# Patient Record
Sex: Male | Born: 1945 | Race: White | Hispanic: No | Marital: Married
Health system: Southern US, Community
[De-identification: ages and names within clinical notes are randomized; demographics above are authoritative.]

## PROBLEM LIST (undated history)

## (undated) DIAGNOSIS — I10 Essential (primary) hypertension: Secondary | ICD-10-CM

---

## 2003-07-12 ENCOUNTER — Other Ambulatory Visit: Payer: Self-pay

## 2003-07-13 ENCOUNTER — Other Ambulatory Visit: Payer: Self-pay

## 2003-07-14 ENCOUNTER — Other Ambulatory Visit: Payer: Self-pay

## 2005-11-08 ENCOUNTER — Ambulatory Visit: Payer: Self-pay | Admitting: Cardiology

## 2005-11-18 ENCOUNTER — Ambulatory Visit: Payer: Self-pay | Admitting: General Surgery

## 2008-12-05 ENCOUNTER — Ambulatory Visit: Payer: Self-pay | Admitting: Cardiology

## 2008-12-26 ENCOUNTER — Ambulatory Visit: Payer: Self-pay | Admitting: Unknown Physician Specialty

## 2010-01-16 IMAGING — CT CT ABD-PELV W/ CM
1 of 2 series · 15 of 32 positions shown, 19 images · non-contrast
Comparison: none

REASON FOR EXAM: acute appendicitis   CALL report  [phone]  or pager
#[phone]
COMMENTS:

[Series 2: appendicitis · axial · 0.71mm/px · z∈[+227,+704]mm · 15 of 175 slices shown, 19 images]
[im 8/175  soft-tissue]
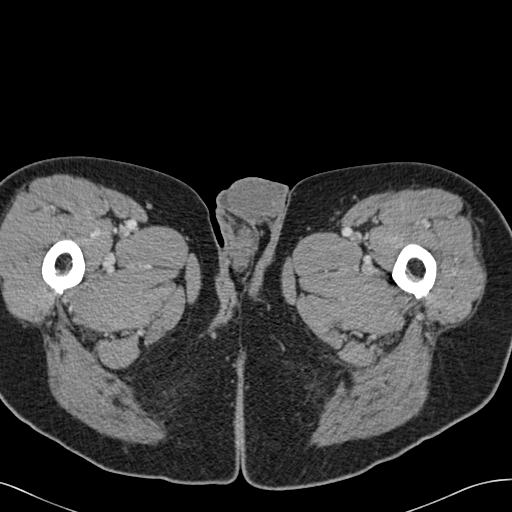
[im 8/175  bone]
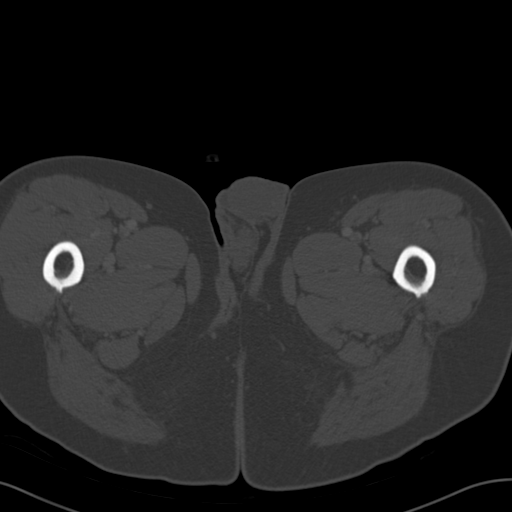
[im 22/175  soft-tissue]
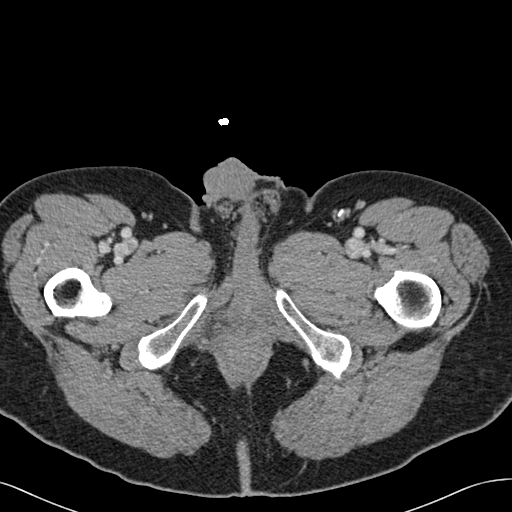
[im 37/175  soft-tissue]
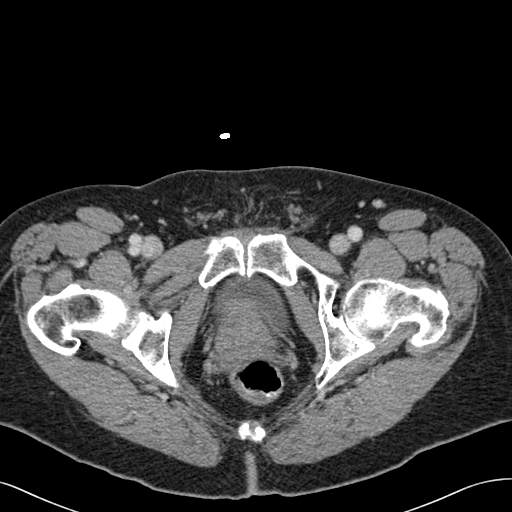
[im 51/175  soft-tissue]
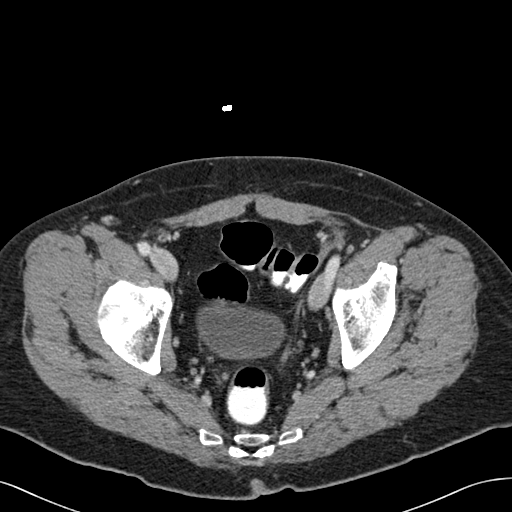
[im 59/175  soft-tissue]
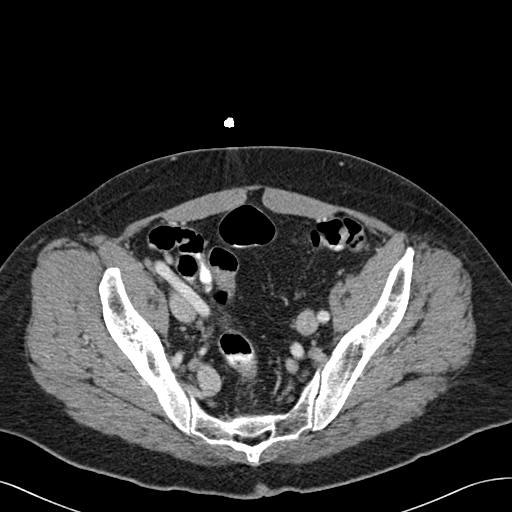
[im 73/175  soft-tissue]
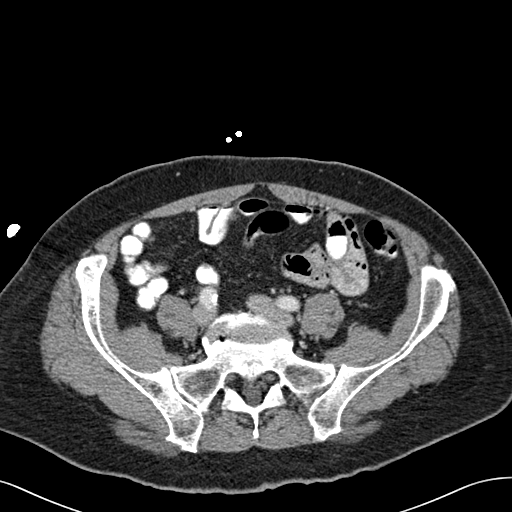
[im 88/175  soft-tissue]
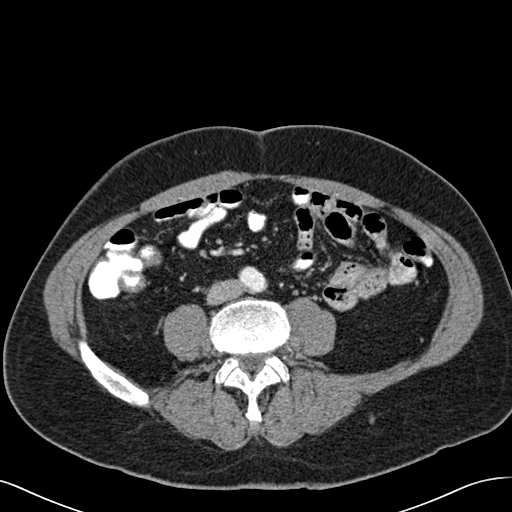
[im 102/175  soft-tissue]
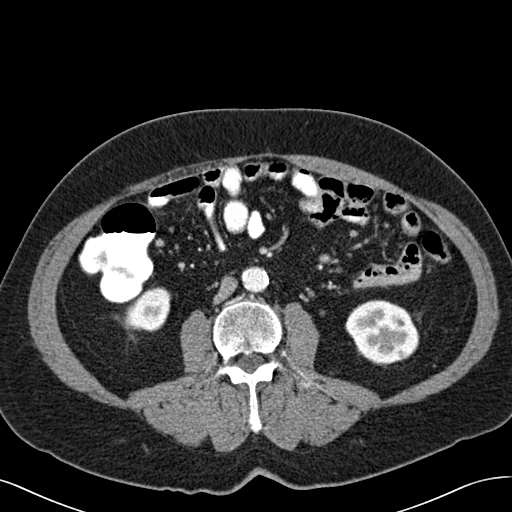
[im 117/175  soft-tissue]
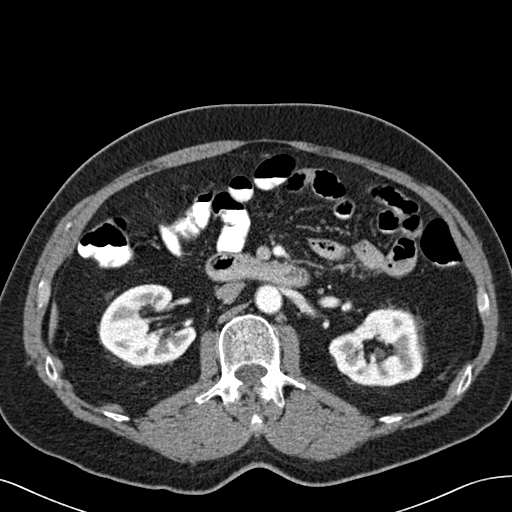
[im 117/175  bone]
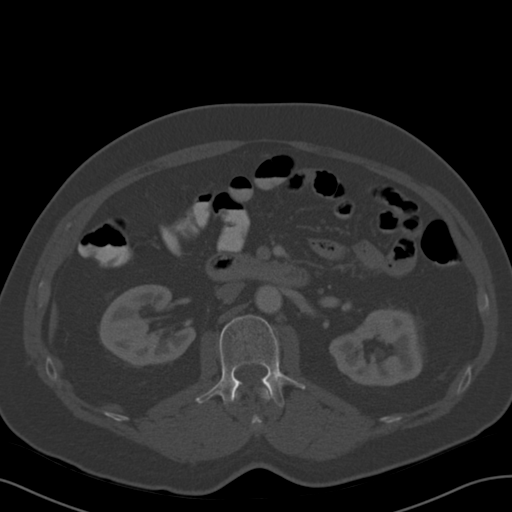
[im 124/175  soft-tissue]
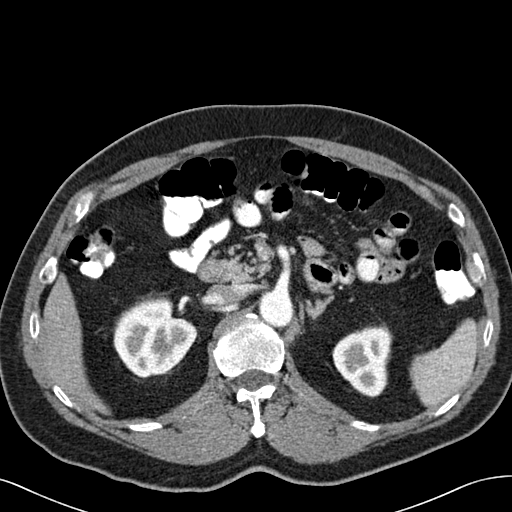
[im 138/175  soft-tissue]
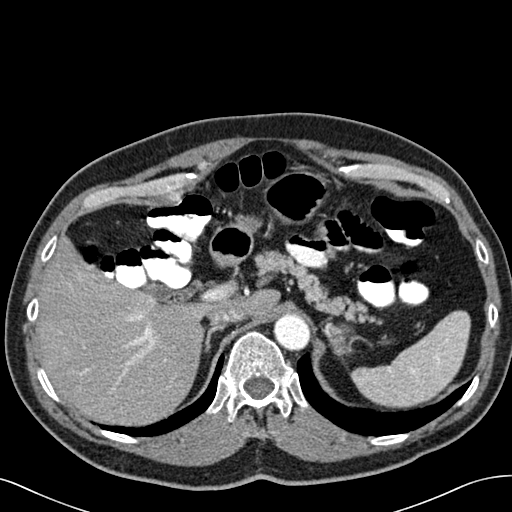
[im 146/175  lung]
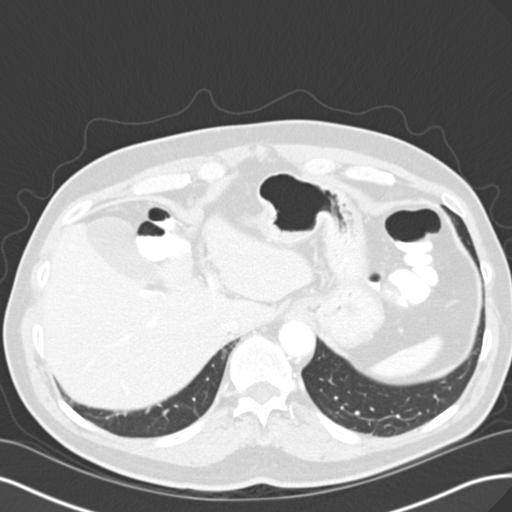
[im 153/175  soft-tissue]
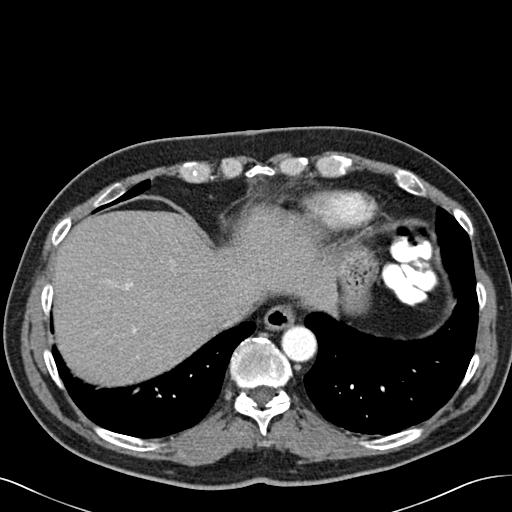
[im 153/175  lung]
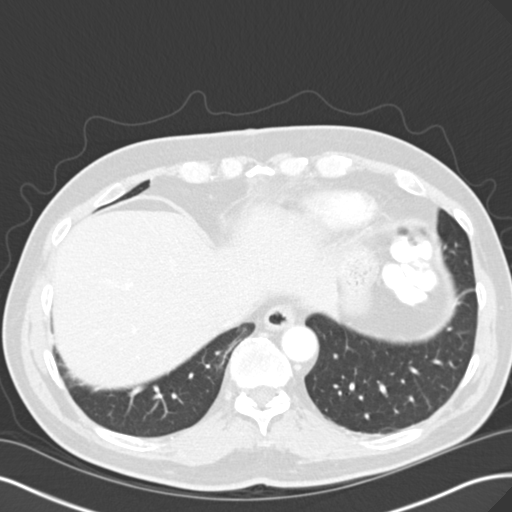
[im 160/175  lung]
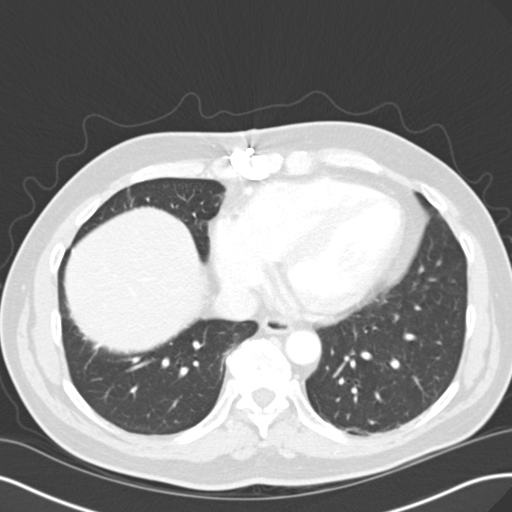
[im 167/175  soft-tissue]
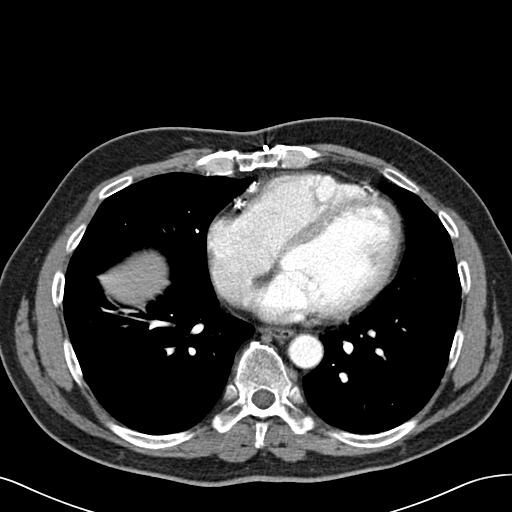
[im 167/175  lung]
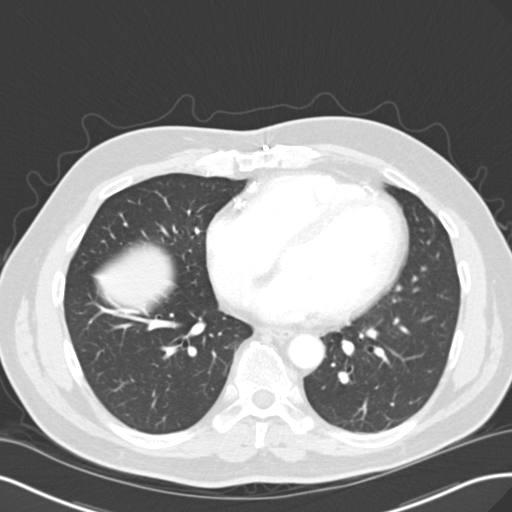

[15 of 32 positions shown; findings below may reference images not displayed]

PROCEDURE:     CT  - CT ABDOMEN / PELVIS  W  - December 26, 2008  [DATE]

RESULT:     CT of the abdomen and pelvis is performed utilizing oral
contrast and 100 mL of Esovue-KKB iodinated intravenous contrast. Images are
reconstructed in the axial plane at 3 mm slice thickness. Further
reconstructions were performed by the interpreting physician at the time of
evaluation utilizing multiplanar thin slice reconstructions and
three-dimensional volume reconstruction created by the webspace software.
There is no previous exam for comparison.

There does not appear to be significant distention of the appendix nor
significant periappendiceal stranding. There may be minimal periappendiceal
stranding adjacent to the base. There is no free fluid. There is no abscess.
There is no abnormal bowel distention. The urinary bladder is unremarkable.
There is some minimal calcification in the prostate. Correlate with PSA. No
definite urinary tract stones or obstructive changes are evident. There may
be some mild fatty infiltration of the liver. No radiopaque gallstones are
evident. The common bile duct is unremarkable. The pancreas, spleen and
adrenal glands as well as the abdominal aorta are within normal limits.
There some minimal atherosclerotic calcification with no aneurysm or
significant vascular stenosis. Again, there is no adenopathy. The lung bases
are clear. The bony structures are unremarkable.
IMPRESSION: No definite CT evidence of acute appendicitis. Findings
were discussed with Dr. Luxon and the patient. There may be some slightly
decreased attenuation of the liver suggestive of minimal fatty infiltration.
There is no free air or free fluid. There is no abscess.

## 2010-08-21 ENCOUNTER — Other Ambulatory Visit: Payer: Self-pay | Admitting: Radiology

## 2010-08-22 ENCOUNTER — Ambulatory Visit: Payer: Self-pay | Admitting: Unknown Physician Specialty

## 2020-02-26 ENCOUNTER — Other Ambulatory Visit: Payer: Self-pay | Admitting: Neurology

## 2020-02-26 DIAGNOSIS — G20 Parkinson's disease: Secondary | ICD-10-CM

## 2020-03-05 ENCOUNTER — Other Ambulatory Visit: Payer: Self-pay

## 2020-03-05 ENCOUNTER — Ambulatory Visit
Admission: RE | Admit: 2020-03-05 | Discharge: 2020-03-05 | Disposition: A | Discharge status: Home / Self Care | Payer: Medicare Other | Source: Ambulatory Visit | Attending: Neurology | Admitting: Neurology

## 2020-03-05 DIAGNOSIS — G20 Parkinson's disease: Secondary | ICD-10-CM | POA: Insufficient documentation

## 2020-12-30 ENCOUNTER — Other Ambulatory Visit: Payer: Self-pay

## 2020-12-30 ENCOUNTER — Emergency Department
Admission: EM | Admit: 2020-12-30 | Discharge: 2020-12-30 | Disposition: A | Discharge status: Home / Self Care | Payer: Medicare Other | Attending: Emergency Medicine | Admitting: Emergency Medicine

## 2020-12-30 DIAGNOSIS — I10 Essential (primary) hypertension: Secondary | ICD-10-CM | POA: Diagnosis not present

## 2020-12-30 DIAGNOSIS — Z79899 Other long term (current) drug therapy: Secondary | ICD-10-CM | POA: Diagnosis not present

## 2020-12-30 DIAGNOSIS — R030 Elevated blood-pressure reading, without diagnosis of hypertension: Secondary | ICD-10-CM | POA: Diagnosis present

## 2020-12-30 HISTORY — DX: Essential (primary) hypertension: I10

## 2020-12-30 MED ORDER — AMLODIPINE BESYLATE 5 MG PO TABS: 5.0000 mg | ORAL_TABLET | Freq: Every day | ORAL | 0 refills | Status: AC

## 2020-12-30 MED ORDER — AMLODIPINE BESYLATE 5 MG PO TABS
5.0000 mg | ORAL_TABLET | Freq: Once | ORAL | Status: AC
  Administered 2020-12-30: 5 mg via ORAL
  Filled 2020-12-30: qty 1

## 2020-12-30 NOTE — Discharge Instructions (Addendum)
Please return to the ER for any headache, vision changes, chest pain, shortness of breath or any urgent changes in your health.  Please record blood pressures twice daily and follow-up with primary care provider to discuss blood pressure recordings to see if you need to make any changes to your medications.

## 2020-12-30 NOTE — ED Notes (Signed)
No new orders per Siadecki at this time.

## 2020-12-30 NOTE — ED Provider Notes (Signed)
[facility] EMERGENCY DEPARTMENT Provider Note   CSN: [id number] Arrival date & time: 12/30/20  1658     History Chief Complaint  Patient presents with   Hypertension    Derrick Bishop is a 75 y.o. male with history of hypertension presents to the emergency department for evaluation of high blood pressure.  Patient was at neurology office earlier today when he was noted to have high blood pressure.  He states he is asymptomatic denies any headaches, vision changes, chest pain, shortness of breath dizziness or lightheadedness.  He takes propranolol and doxazosin daily, has been on these medications for years.  Has a history of open heart surgery 10 years ago.  Patient has been taking his medications regularly.  Patient admits to being very anxious.    HPI     Past Medical History:  Diagnosis Date   Hypertension     There are no problems to display for this patient.   History reviewed. No pertinent surgical history.     No family history on file.     Home Medications Prior to Admission medications   Medication Sig Start Date End Date Taking? Authorizing Provider  amLODipine (NORVASC) 5 MG tablet Take 1 tablet (5 mg total) by mouth daily. 12/30/20 12/30/21 Yes Derrick Slack, PA-C  carbidopa-levodopa (SINEMET IR) 25-100 MG tablet Take 1 tablet by mouth 3 (three) times daily.   Yes [provider]  doxazosin (CARDURA) 4 MG tablet Take 4 mg by mouth daily.   Yes [provider]  propranolol (INDERAL) 40 MG tablet Take 40 mg by mouth 3 (three) times daily.   Yes [provider]  rosuvastatin (CRESTOR) 10 MG tablet Take 10 mg by mouth daily.   Yes [provider]    Allergies    Patient has no allergy information on record.  Review of Systems   Review of Systems  Constitutional:  Negative for fatigue.  Eyes:  Negative for photophobia, pain, redness and visual disturbance.  Respiratory:  Negative for chest tightness  and shortness of breath.   Cardiovascular:  Negative for chest pain.  Gastrointestinal:  Negative for nausea and vomiting.  Skin:  Negative for rash.  Neurological:  Negative for dizziness, syncope, light-headedness, numbness and headaches.   Physical Exam Updated Vital Signs BP (!) 217/97 (BP Location: Right Arm)   Pulse 66   Temp 98 F (36.7 C)   Resp 18   SpO2 100%   Physical Exam Constitutional:      Appearance: He is well-developed.  HENT:     Head: Normocephalic and atraumatic.     Right Ear: Ear canal normal.     Left Ear: Ear canal normal.     Nose: Nose normal.     Mouth/Throat:     Pharynx: No oropharyngeal exudate or posterior oropharyngeal erythema.  Eyes:     Conjunctiva/sclera: Conjunctivae normal.  Cardiovascular:     Rate and Rhythm: Normal rate.     Pulses: Normal pulses.     Heart sounds: Normal heart sounds.  Pulmonary:     Effort: Pulmonary effort is normal. No respiratory distress.  Abdominal:     General: There is no distension.     Tenderness: There is no abdominal tenderness. There is no guarding.  Musculoskeletal:        General: Normal range of motion.     Cervical back: Normal range of motion.  Skin:    General: Skin is warm.  Findings: No rash.  Neurological:     General: No focal deficit present.     Mental Status: He is alert and oriented to person, place, and time. Mental status is at baseline.     Cranial Nerves: No cranial nerve deficit.     Motor: No weakness.     Gait: Gait normal.     Comments: Signs of anxiousness  Psychiatric:        Behavior: Behavior normal.        Thought Content: Thought content normal.    ED Results / Procedures / Treatments   Labs (all labs ordered are listed, but only abnormal results are displayed) Labs Reviewed - No data to display  EKG None  Radiology No results found.  Procedures Procedures   Medications Ordered in ED Medications  amLODipine (NORVASC) tablet 5 mg (has no  administration in time range)    ED Course  I have reviewed the triage vital signs and the nursing notes.  Pertinent labs & imaging results that were available during my care of the patient were reviewed by me and considered in my medical decision making (see chart for details).    MDM Rules/Calculators/A&P                          75 year old male with hypertension.  Patient asymptomatic, does appear to be anxious and admits to being anxious.  Blood pressure checked several times in both arms and does appear to be consistently elevated. Discussed HTN with patient, will start norvasc 5 mg daily and have patient record BP BID to discuss with PCP. He understands signs and symptoms to return to the ED for.     Final Clinical Impression(s) / ED Diagnoses Final diagnoses:  Hypertension, unspecified type    Rx / DC Orders ED Discharge Orders          Ordered    amLODipine (NORVASC) 5 MG tablet  Daily        12/30/20 1832             Derrick Bishop 12/30/20 1841    Derrick Cheek, MD 12/30/20 2122

## 2020-12-30 NOTE — ED Triage Notes (Signed)
Pt comes with c/o HTN. Pt states he went to his PCP for check up. Pt states they told him his BP was elevated and he needed to come here.   Pt denies any complaints. Pt states he takes BP meds and took it today.

## 2021-03-26 IMAGING — CT CT HEAD W/O CM
3 of 4 series · 14 of 47 positions shown, 16 images · non-contrast
Comparison: None.

CLINICAL DATA: Worsening hand tremors

EXAM:
CT HEAD WITHOUT CONTRAST
TECHNIQUE: Contiguous axial images were obtained from the base of the skull
through the vertex without intravenous contrast.

[Series 2: axial st head 5.00 ax · axial · 0.36mm/px · z∈[-518,-398]mm · 8 of 29 slices shown, 10 images]
[im 3/29  brain]
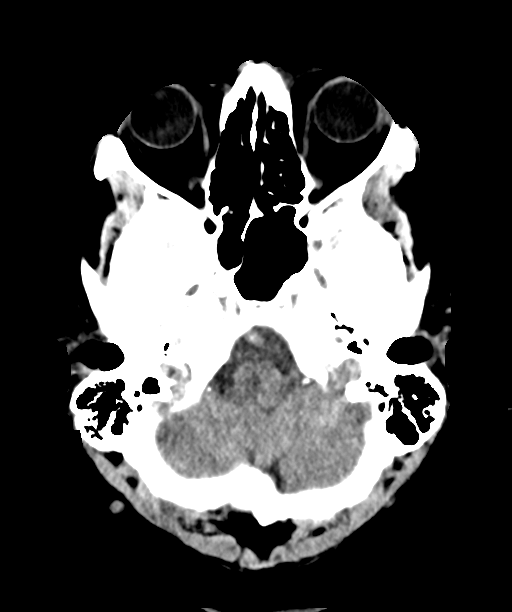
[im 3/29  bone]
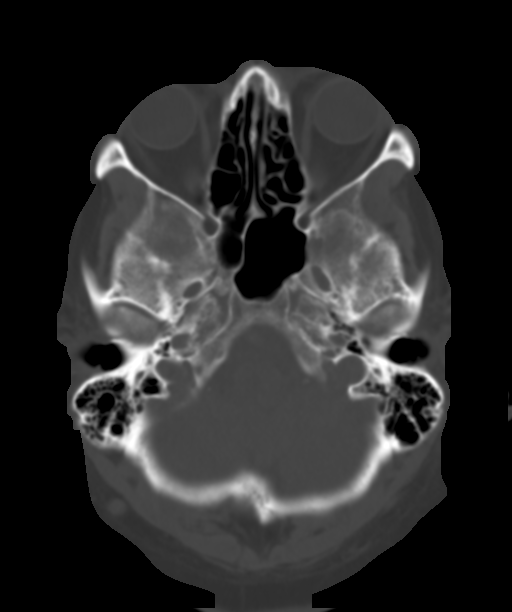
[im 7/29  brain]
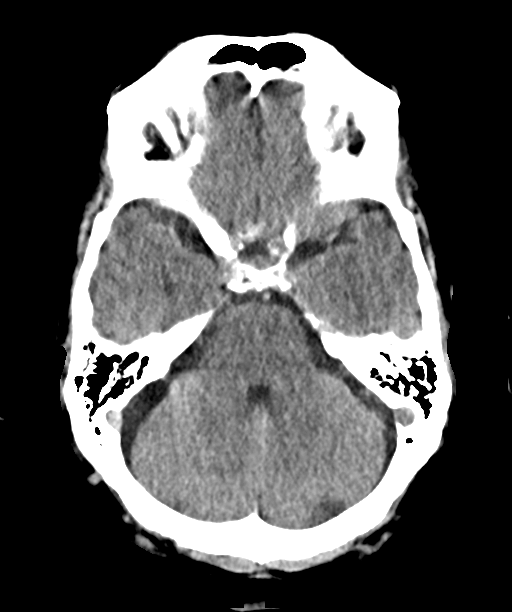
[im 11/29  brain]
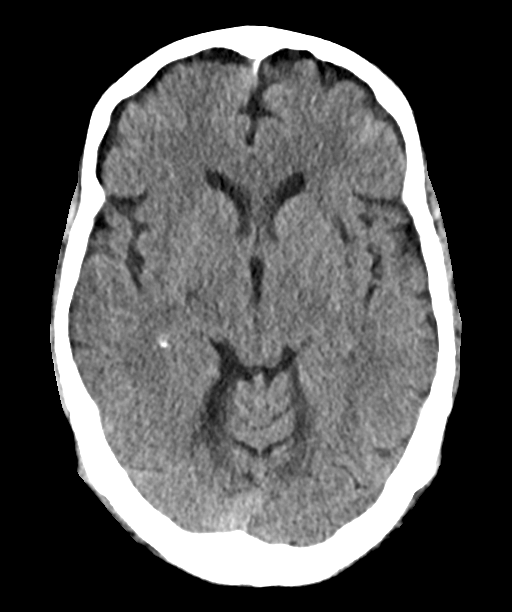
[im 13/29  brain]
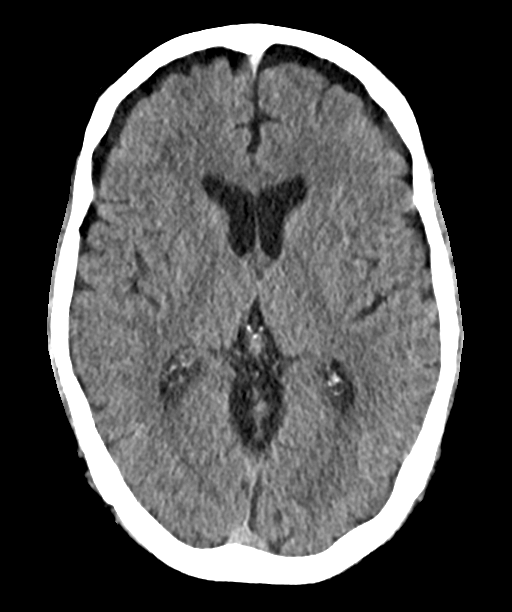
[im 17/29  brain]
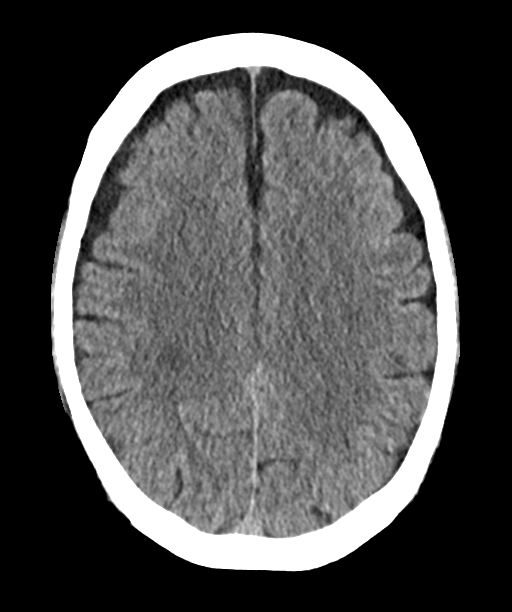
[im 17/29  bone]
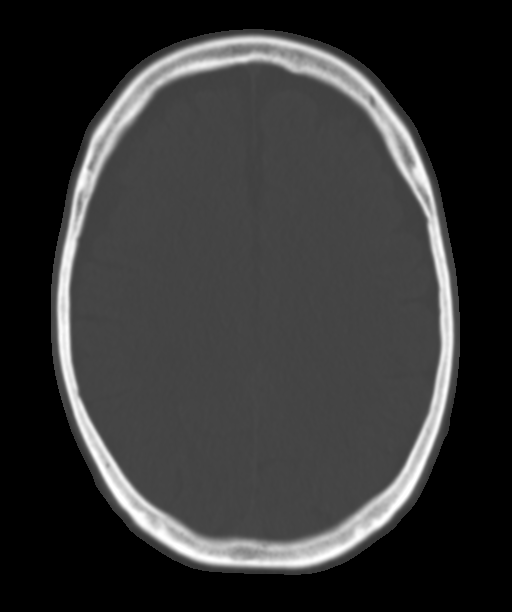
[im 19/29  brain]
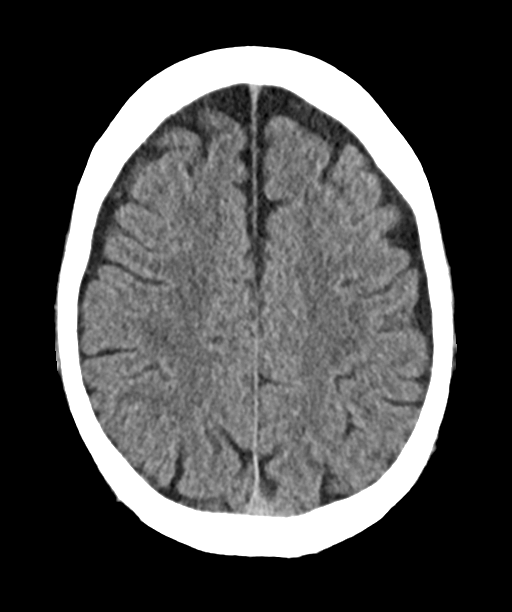
[im 23/29  brain]
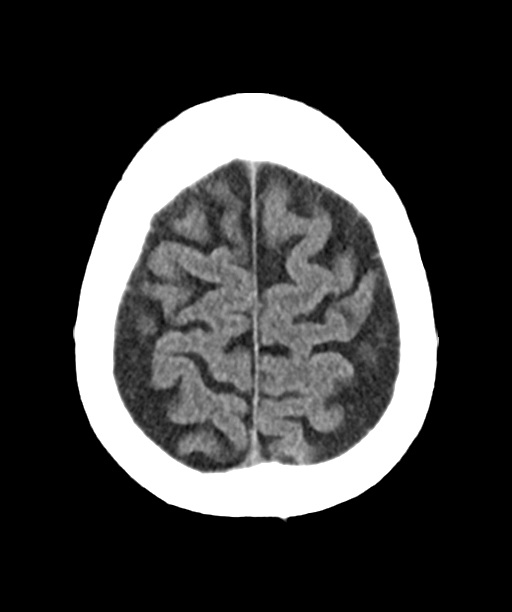
[im 27/29  brain]
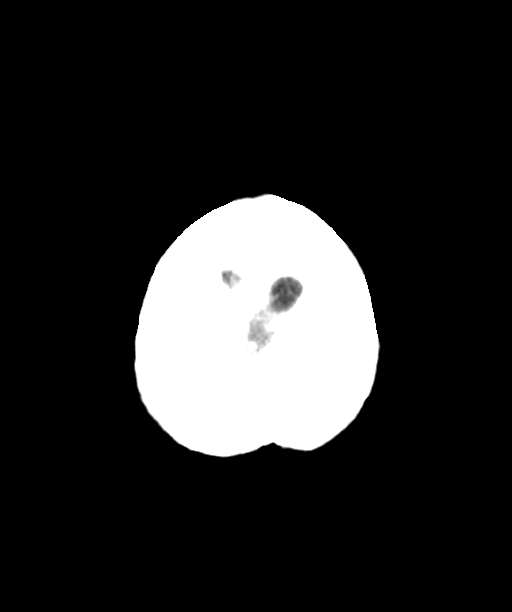

[Series 6: coronals head 3.00 cor · coronal · 0.29mm/px · 3 of 73 slices shown]
[im 25/73  brain]
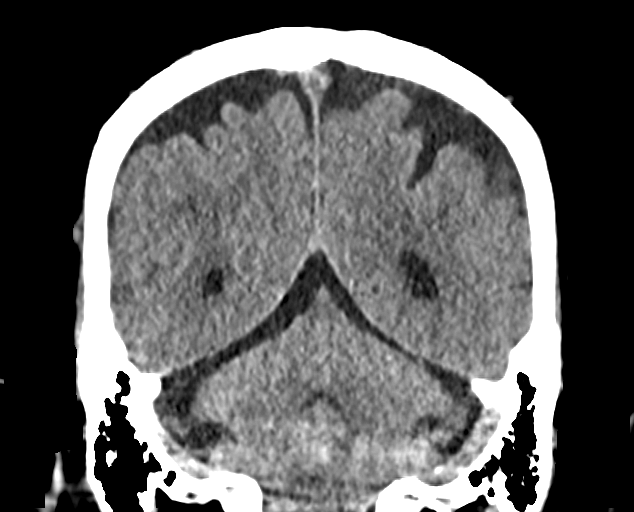
[im 33/73  brain]
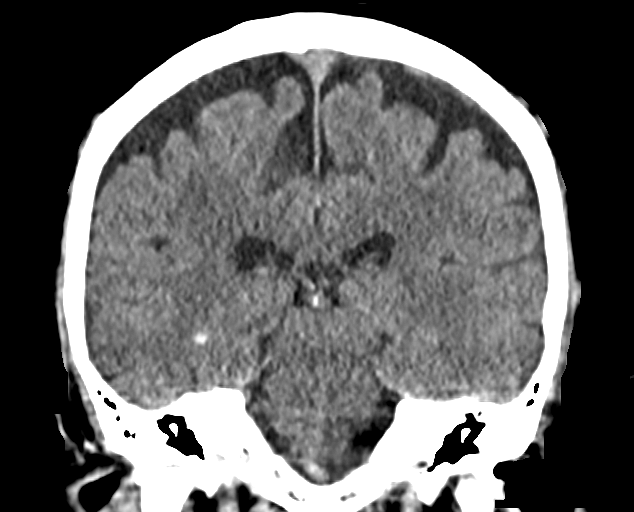
[im 41/73  brain]
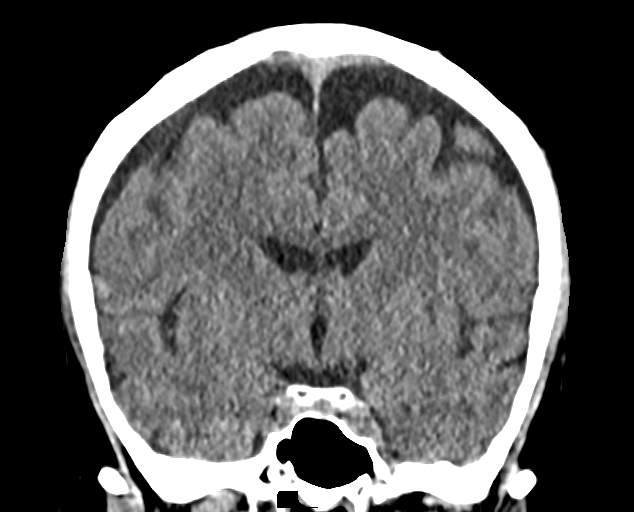

[Series 8: sagittals head 3.00 sag · sagittal · 0.29mm/px · 3 of 61 slices shown]
[im 21/61  brain]
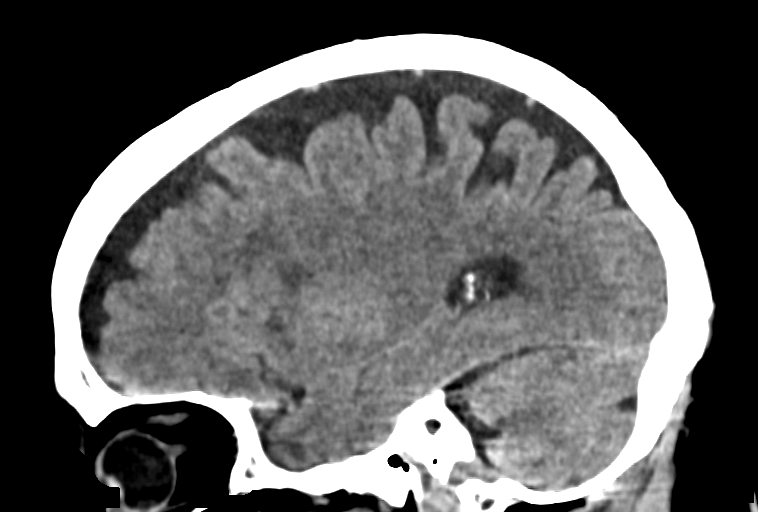
[im 31/61  brain]
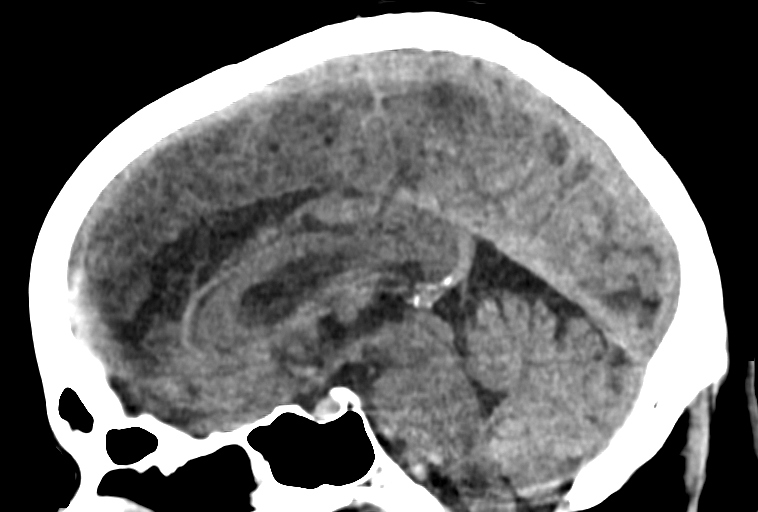
[im 41/61  brain]
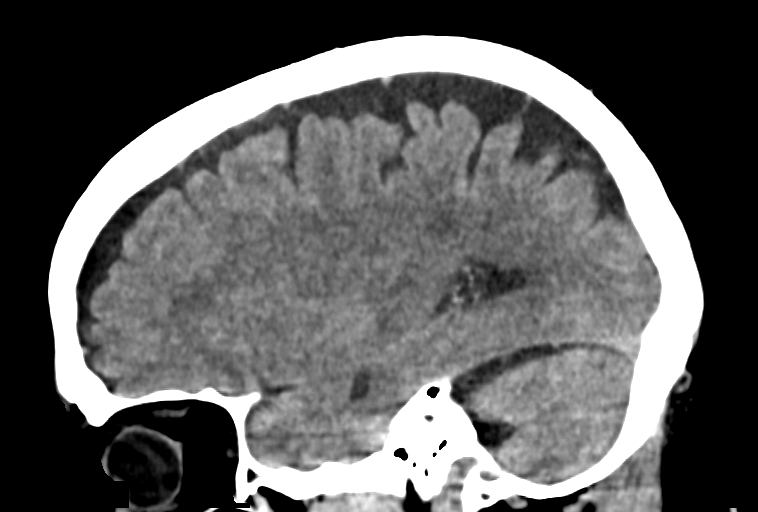

[14 of 47 positions shown; findings below may reference images not displayed]

FINDINGS: Brain: There is no acute intracranial hemorrhage, mass effect, or
edema. Gray-white differentiation is preserved. Ventricles and sulci
are within normal limits in size and configuration. Mild prominence
of extra-axial spaces along the cerebral and cerebellar convexities
likely reflects mild volume loss. No definite extra-axial fluid
collection. Patchy hypoattenuation in the supratentorial white
matter is nonspecific but may reflect mild chronic microvascular
ischemic changes.

Vascular: No hyperdense vessel or unexpected calcification.

Skull: Calvarium is unremarkable.

Sinuses/Orbits: No acute or significant finding.

Other: None.
IMPRESSION: No acute intracranial abnormality. Mild chronic microvascular
ischemic changes.

## 2021-12-28 ENCOUNTER — Emergency Department: Payer: Medicare Other

## 2021-12-28 ENCOUNTER — Other Ambulatory Visit: Payer: Self-pay

## 2021-12-28 ENCOUNTER — Emergency Department
Admission: EM | Admit: 2021-12-28 | Discharge: 2021-12-28 | Disposition: A | Discharge status: Home / Self Care | Payer: Medicare Other | Attending: Emergency Medicine | Admitting: Emergency Medicine

## 2021-12-28 DIAGNOSIS — Z79899 Other long term (current) drug therapy: Secondary | ICD-10-CM | POA: Insufficient documentation

## 2021-12-28 DIAGNOSIS — G20 Parkinson's disease: Secondary | ICD-10-CM | POA: Insufficient documentation

## 2021-12-28 DIAGNOSIS — R29810 Facial weakness: Secondary | ICD-10-CM | POA: Diagnosis not present

## 2021-12-28 DIAGNOSIS — I10 Essential (primary) hypertension: Secondary | ICD-10-CM | POA: Insufficient documentation

## 2021-12-28 DIAGNOSIS — Z7982 Long term (current) use of aspirin: Secondary | ICD-10-CM | POA: Diagnosis not present

## 2021-12-28 LAB — DIFFERENTIAL
Abs Immature Granulocytes: 0.03 10*3/uL (ref 0.00–0.07)
Basophils Absolute: 0 10*3/uL (ref 0.0–0.1)
Basophils Relative: 1 %
Eosinophils Absolute: 0.2 10*3/uL (ref 0.0–0.5)
Eosinophils Relative: 3 %
Immature Granulocytes: 1 %
Lymphocytes Relative: 23 %
Lymphs Abs: 1.5 10*3/uL (ref 0.7–4.0)
Monocytes Absolute: 0.4 10*3/uL (ref 0.1–1.0)
Monocytes Relative: 7 %
Neutro Abs: 4.1 10*3/uL (ref 1.7–7.7)
Neutrophils Relative %: 65 %

## 2021-12-28 LAB — CBC
HCT: 44.7 % (ref 39.0–52.0)
Hemoglobin: 14.7 g/dL (ref 13.0–17.0)
MCH: 29.5 pg (ref 26.0–34.0)
MCHC: 32.9 g/dL (ref 30.0–36.0)
MCV: 89.6 fL (ref 80.0–100.0)
Platelets: 143 10*3/uL — ABNORMAL LOW (ref 150–400)
RBC: 4.99 MIL/uL (ref 4.22–5.81)
RDW: 13.5 % (ref 11.5–15.5)
WBC: 6.3 10*3/uL (ref 4.0–10.5)
nRBC: 0 % (ref 0.0–0.2)

## 2021-12-28 LAB — PROTIME-INR
INR: 1 (ref 0.8–1.2)
Prothrombin Time: 13.4 seconds (ref 11.4–15.2)

## 2021-12-28 LAB — COMPREHENSIVE METABOLIC PANEL
ALT: <5 U/L (ref 0–44)
AST: 19 U/L (ref 15–41)
Albumin: 3.8 g/dL (ref 3.5–5.0)
Alkaline Phosphatase: 63 U/L (ref 38–126)
Anion gap: 7 (ref 5–15)
BUN: 19 mg/dL (ref 8–23)
CO2: 25 mmol/L (ref 22–32)
Calcium: 9.2 mg/dL (ref 8.9–10.3)
Chloride: 107 mmol/L (ref 98–111)
Creatinine, Ser: 0.85 mg/dL (ref 0.61–1.24)
GFR, Estimated: >60 mL/min (ref >60)
Glucose, Bld: 112 mg/dL — ABNORMAL HIGH (ref 70–99)
Potassium: 4.2 mmol/L (ref 3.5–5.1)
Sodium: 139 mmol/L (ref 135–145)
Total Bilirubin: 0.8 mg/dL (ref 0.3–1.2)
Total Protein: 6.9 g/dL (ref 6.5–8.1)

## 2021-12-28 LAB — ETHANOL: Alcohol, Ethyl (B): <10 mg/dL (ref <10)

## 2021-12-28 LAB — APTT: aPTT: 31 seconds (ref 24–36)

## 2021-12-28 MED ORDER — SODIUM CHLORIDE 0.9% FLUSH: 3.0000 mL | Freq: Once | INTRAVENOUS | Status: DC

## 2021-12-28 MED ORDER — LORAZEPAM 2 MG/ML IJ SOLN: 1.0000 mg | INTRAMUSCULAR | Status: DC | PRN

## 2021-12-28 NOTE — ED Triage Notes (Signed)
Pt comes from [facility] with c/o facial droop. Per KC unknown LKW. Pt didn't recognize droop. Pt states he went to [facility] for neuro check up. Pt states hx of bells palsy in past on right side. Pt denies any blurry vision, dizziness, weakness or any other symptoms.   Pt does have facial droop noted. Pt is van neg.

## 2021-12-28 NOTE — ED Provider Notes (Signed)
[facility] Provider Note    Event Date/Time   First MD Initiated Contact with Patient 12/28/21 [phone]     (approximate)   History   Chief Complaint Facial Droop   HPI  JENIEL SLAUSON is a 76 y.o. male with past medical history of hypertension and Parkinson disease who presents to the ED complaining of facial droop.  Patient reports that he went to his usually scheduled neurology appointment this morning for Parkinson's disease and was told there that his left face was drooping.  He states that he has not noticed this at home and he is unsure how long it has been going on for him.  He denies any vision changes, speech changes, numbness or weakness in his extremities.  Wife at bedside states she also was not aware of any facial drooping.  Patient reports a history of Bell's palsy on the right side about 10 years ago, has not had any issues since then.     Physical Exam   Triage Vital Signs: ED Triage Vitals  Enc Vitals Group     BP 12/28/21 0843 (!) 198/101     Pulse Rate 12/28/21 0843 (!) 56     Resp 12/28/21 0843 16     Temp 12/28/21 0843 98.9 F (37.2 C)     Temp Source 12/28/21 0843 Oral     SpO2 12/28/21 0843 97 %     Weight 12/28/21 0858 200 lb (90.7 kg)     Height 12/28/21 0858 5\' 10"  (1.778 m)     Head Circumference --      Peak Flow --      Pain Score 12/28/21 0843 0     Pain Loc --      Pain Edu? --      Excl. in GC? --     Most recent vital signs: Vitals:   12/28/21 1040 12/28/21 1130  BP: (!) 161/93 (!) 193/85  Pulse: (!) 53 (!) 55  Resp: 12 17  Temp:    SpO2: 98% 99%    Constitutional: Alert and oriented. Eyes: Conjunctivae are normal. Head: Atraumatic. Nose: No congestion/rhinnorhea. Mouth/Throat: Mucous membranes are moist.  Cardiovascular: Normal rate, regular rhythm. Grossly normal heart sounds.  2+ radial pulses bilaterally. Respiratory: Normal respiratory effort.  No retractions. Lungs CTAB. Gastrointestinal: Soft  and nontender. No distention. Musculoskeletal: No lower extremity tenderness nor edema.  Neurologic:  Normal speech and language.  Left facial droop noted, does not appear to involve the upper face.    ED Results / Procedures / Treatments   Labs (all labs ordered are listed, but only abnormal results are displayed) Labs Reviewed  CBC - Abnormal; Notable for the following components:      Result Value   Platelets 143 (*)    All other components within normal limits  COMPREHENSIVE METABOLIC PANEL - Abnormal; Notable for the following components:   Glucose, Bld 112 (*)    All other components within normal limits  PROTIME-INR  APTT  DIFFERENTIAL  ETHANOL  I-STAT CREATININE, ED  CBG MONITORING, ED     EKG  ED ECG REPORT I, 02/28/22, the attending physician, personally viewed and interpreted this ECG.   Date: 12/28/2021  EKG Time: 8:51  Rate: 56  Rhythm: sinus bradycardia  Axis: Normal  Intervals:none  ST&T Change: None  RADIOLOGY CT head reviewed and interpreted by me with no hemorrhage or midline shift.  PROCEDURES:  Critical Care performed: No  Procedures   MEDICATIONS  ORDERED IN ED: Medications  sodium chloride flush (NS) 0.9 % injection 3 mL (3 mLs Intravenous Not Given 12/28/21 0850)  LORazepam (ATIVAN) injection 1 mg (has no administration in time range)     IMPRESSION / MDM / ASSESSMENT AND PLAN / ED COURSE  I reviewed the triage vital signs and the nursing notes.                              76 y.o. male with past medical history of hypertension and Parkinson disease who presents to the ED complaining of left-sided facial droop noted at his routine neurology follow-up earlier this morning.  Patient's presentation is most consistent with acute presentation with potential threat to life or bodily function.  Differential diagnosis includes, but is not limited to, stroke, TIA, Bell's palsy, electrolyte abnormality, chronic facial droop.  Patient  nontoxic-appearing and in no acute distress, vital signs remarkable for hypertension but otherwise reassuring.  Patient does appear to have a left-sided facial droop without involvement of his left upper face, no other focal deficits noted.  Presentation is concerning for acute stroke and we will further assess with CT scan, unfortunately patient without any last known well time.  Labs thus far are reassuring without significant anemia or leukocytosis, coags are within normal limits.  CT scan is negative for acute process, MRI was performed and also negative for acute stroke.  Findings reviewed with Dr. Viviann Spare of neurology, who agrees that patient is appropriate for discharge home with outpatient follow-up given he is already on aspirin and statin for stroke prevention and Bell's palsy thought to be unlikely given weakness involves patient's left upper face.  Patient counseled to return to the ED for new worsening symptoms, patient agrees to plan.      FINAL CLINICAL IMPRESSION(S) / ED DIAGNOSES   Final diagnoses:  Facial droop     Rx / DC Orders   ED Discharge Orders     None        Note:  This document was prepared using Dragon voice recognition software and may include unintentional dictation errors.   Chesley Noon, MD 12/28/21 [phone]

## 2021-12-28 NOTE — Plan of Care (Signed)
The patient with history of Parkinson's and right Bell's palsy.  Has new left facial droop only discovered on routine visit today.  No other focal findings per ER physician.  MRI completed was negative for CVA.  He is on a statin and aspirin for stroke prevention.  Patient is anxious to go home and can be followed up outpatient as there is no emergent intervention to offer.

## 2021-12-28 NOTE — ED Notes (Signed)
Pt taken to MRI  

## 2023-10-03 ENCOUNTER — Ambulatory Visit: Attending: Neurology

## 2023-10-03 ENCOUNTER — Ambulatory Visit

## 2023-10-03 DIAGNOSIS — R2681 Unsteadiness on feet: Secondary | ICD-10-CM | POA: Insufficient documentation

## 2023-10-03 DIAGNOSIS — G20A1 Parkinson's disease without dyskinesia, without mention of fluctuations: Secondary | ICD-10-CM | POA: Diagnosis present

## 2023-10-03 DIAGNOSIS — M6281 Muscle weakness (generalized): Secondary | ICD-10-CM

## 2023-10-03 DIAGNOSIS — R262 Difficulty in walking, not elsewhere classified: Secondary | ICD-10-CM | POA: Insufficient documentation

## 2023-10-03 DIAGNOSIS — R471 Dysarthria and anarthria: Secondary | ICD-10-CM | POA: Diagnosis present

## 2023-10-03 DIAGNOSIS — R278 Other lack of coordination: Secondary | ICD-10-CM | POA: Insufficient documentation

## 2023-10-03 DIAGNOSIS — R269 Unspecified abnormalities of gait and mobility: Secondary | ICD-10-CM

## 2023-10-03 NOTE — Therapy (Signed)
 OUTPATIENT SPEECH LANGUAGE PATHOLOGY PARKINSON'S EVALUATION   Patient Name: Derrick Bishop MRN: [id number] DOB:1946-06-15, 78 y.o., male Today's Date: 10/03/2023  PCP: Bethann Punches, MD REFERRING PROVIDER: Cristopher Peru, MD   End of Session - 10/03/23 1159     Visit Number 1    Number of Visits 24    Date for SLP Re-Evaluation 12/26/23    SLP Start Time 1010    SLP Stop Time  1050    SLP Time Calculation (min) 40 min    Activity Tolerance Patient tolerated treatment well             Past Medical History:  Diagnosis Date   Hypertension    No past surgical history on file. There are no active problems to display for this patient.   ONSET DATE:  ~3 years ago;   REFERRING DIAG: Parkinson's Disease without dyskinesia, without mention of fluctuations  THERAPY DIAG:  Dysarthria and anarthria  Parkinson's disease without dyskinesia or fluctuating manifestations (HCC)  Rationale for Evaluation and Treatment Rehabilitation  SUBJECTIVE:   SUBJECTIVE STATEMENT: Pt alert, pleasant, and cooperative. Pt accompanied by: significant other  PERTINENT HISTORY: Per Neuro note, 09/15/23, "Parkinson's Disease in a patient with bilateral hand tremor (left > right) + slowness of movement + some changes in balance + hypophonia + micro graphia + hypomimia + minimal bradykinesia on the left + Mild cog wheeling on the right and moderate cog wheeling on the left + decreased arm swing on the left + slight hunched forward posture - symptomatic, without new or worsening features." Pt with hx HTN, HLD, Vit B12 and Vit deficiencies. MRI brain, 12/28/21, "no a cute infarction, hemorrhage, or mass. Mild chronic microvascular ischemic changes."    PAIN:  Are you having pain? No   FALLS: Has patient fallen in last 6 months? Defer to PT  LIVING ENVIRONMENT: Lives with: lives with their spouse Lives in: House/apartment  PLOF:  ambulates with single point cane; driving; indep  PATIENT GOALS  for  speech to improve  OBJECTIVE:  COGNITION: Overall cognitive status:  endorses some changes to short term memory over the last "few" months - will address in upcoming sessions   SOCIAL HISTORY: Occupation:  retired Counsellor intake: suboptimal Caffeine/alcohol intake: minimal and moderate Daily voice use: minimal  ORAL MOTOR EXAMINATION Facial : Symmetry impaired: Impaired left Lingual: WFL Velum: WFL Mandible: WFL; jaw tremor Cough: WFL Voice: Hoarse; Hypophonic   Patient does not report difficulty swallowing which does not warrant further evaluation  PERCEPTUAL VOICE ASSESSMENT: Voice quality: hoarse, low vocal intensity, and vocal fatigue Vocal abuse:  n/a Resonance: normal Respiratory function: clavicular breathing   LSVT-LOUD VOICE EVALUATION: Maximum phonation time for sustained "ah": 8s Mean intensity during sustained "ah": 77 dB  Mean fundamental frequency during sustained "ah": 130 Hz (average of 145 Hz +/- 60for age and gender) Mean intensity sustained during conversational speech:71 dB Mean intensity sustained during reading: 72dB Habitual pitch: 120 Hz Highest dynamic pitch when altering pitch from a low note to a high note: 286 Hz Lowest dynamic pitch when altering from a high note to a low note: 53 Hz Highest dynamic pitch during conversational speech: 170 Hz Lowest dynamic pitch during conversational speech: 107 Hz   Speech is characterized by Variable rate and Hypophonia. For trained listener in quiet environment with context, intelligibility was >95%.   Patient able to improve all parameters with model ("Loud like me") Stimulability: Improved vocal quality with loud voice to 74dB with cues at the  sentence level.   PATIENT REPORTED OUTCOME  The Communication Effectiveness Survey is a patient-reported outcome measure in which the patient rates their own effectiveness in different communication situations. A higher score indicates greater effectiveness.    Pt's self-rating was 24/32.   Having a conversation with a family member or friends at home. 3 Participating in conversation with strangers in a quiet place. 3 Conversing with a familiar person over the telephone. 4- Very Effective Conversing with a stranger over the telephone. 3 Being part of a conversation in a noisy environment (social gathering). 3 Speaking to a friend when you are emotionally upset or you are angry. 2 Having a conversation while traveling in a car. 4- Very Effective Having a conversation with someone at a distance (across a room). 3   TODAY'S TREATMENT:  Pt and wife educated re: role of SLP, changes to speech/voice due to Parkinson's Disease, results of assessment, SLP POC, and rationale for ST.   PATIENT EDUCATION: Education details: as above Person educated: Patient and Wife Education method: Explanation Education comprehension: verbalized understanding and needs further education   HOME EXERCISE PROGRAM: To be provided in upcoming sessions  GOALS: Goals reviewed with patient? Yes  SHORT TERM GOALS: Target date: 10 sessions  Pt will participate in further assessment of functional cognitive-linguistic ability with additional goals to be set as appropriate. Baseline: Goal status: INITIAL  2.  The patient will complete HEP (Maximum duration "ah", High/Lows, and Functional Phrases) at average loudness >/= 80 dB and with loud, good quality voice with min cues.  Baseline: Goal status: INITIAL  3.  Pt will sustain conversational loudness (75-85 dB) for 3-5 minutes of conversation with min cues. Baseline:  Goal status: INITIAL    LONG TERM GOALS: Target date: 12 weeks  The patient will complete HEP (Maximum duration "ah", High/Lows, and Functional Phrases) at average loudness >/= 80 dB and with loud, good quality voice indep. Baseline: Goal status: INITIAL  Pt will sustain conversational loudness (75-85 dB) for 8-10 minutes of conversation with  min cues. Baseline:  Goal status: INITIAL   ASSESSMENT:  CLINICAL IMPRESSION: Patient is a 78 y.o. male who was seen today for evaluation in setting of Parkinson's Disease. er Neuro note, 09/15/23, "Parkinson's Disease in a patient with bilateral hand tremor (left > right) + slowness of movement + some changes in balance + hypophonia + micro graphia + hypomimia + minimal bradykinesia on the left + Mild cog wheeling on the right and moderate cog wheeling on the left + decreased arm swing on the left + slight hunched forward posture - symptomatic, without new or worsening features." Pt taking carbidopa-levodopa for Parkinson's Disease with recent increased in dosage.  Pt with hx HTN, HLD, Vit B12 and Vit deficiencies. MRI brain, 12/28/21, "no a cute infarction, hemorrhage, or mass. Mild chronic microvascular ischemic changes." Pt presents with a mild hypokinetic dysarthric c/b hypophonia, mildly hoarse vocal quality and intermittent fast rushes of speech. Evidence of volume decay noted. Wife endorsed that speech is worse in home with pt often "muttering." Wife also identified x2 persons who have stopped communicating with pt due to difficulty hearing him. Pt stimulable to verbal cues and would benefit from course of ST to improve pt's ability to communicate at home and in the community.  OBJECTIVE IMPAIRMENTS include dysarthria. These impairments are limiting patient from effectively communicating at home and in community. Factors affecting potential to achieve goals and functional outcome are n/a. Patient will benefit from skilled SLP services to  address above impairments and improve overall function.  REHAB POTENTIAL: Good  PLAN: SLP FREQUENCY: 1-2x/week  SLP DURATION: 12 weeks  PLANNED INTERVENTIONS: Functional tasks, Multimodal communication approach, SLP instruction and feedback, Compensatory strategies, Patient/family education, and further cognitive-linguistic assessment.   Clyde Canterbury,  M.S., CCC-SLP Speech-Language Pathologist [place] [facility] [phone] Arnette Felts)   [facility] [address] [place], [place], [address] Phone: [phone]   Fax:  [phone]

## 2023-10-03 NOTE — Therapy (Signed)
 OUTPATIENT PHYSICAL THERAPY NEURO EVALUATION   Patient Name: Derrick Bishop MRN: [id number] DOB:16-Jan-1946, 78 y.o., male Today's Date: 10/03/2023   PCP: Dr. Bethann Punches REFERRING PROVIDER: Dr. Cristopher Peru  END OF SESSION:   Past Medical History:  Diagnosis Date   Hypertension    No past surgical history on file. There are no active problems to display for this patient.   ONSET DATE: 3 years ago  REFERRING DIAG: Parkinson's disease without dyskinesia, without mention of fluctuations   THERAPY DIAG:  No diagnosis found.  Rationale for Evaluation and Treatment: Rehabilitation  SUBJECTIVE:                                                                                                                                                                                             SUBJECTIVE STATEMENT: I just have to be careful with mobility- I dont use the cane at home- just take it with me. Patient reports having Parkinsons for about 3 years. He denies any falls but reports being very careful with all mobility. He reports he would like to be more confident in his mobility. Wife states he has been more sedentary and not as mobile.    Pt accompanied by: significant other- Joyce  PERTINENT HISTORY: History obtained by Dr. Sherryll Burger on 09/15/2023 Assessment & Plan Parkinson's Disease in a patient with bilateral hand tremor (left > right) + slowness of movement + some changes in balance + hypophonia + micro graphia + hypomimia + minimal bradykinesia on the left + Mild cog wheeling on the right and moderate cog wheeling on the left + decreased arm swing on the left + slight hunched forward posture - symptomatic, without new or worsening features   Parkinson's disease with worsening symptoms, including increased hand tremors, balance problems, and freezing episodes. Current treatment with carbidopa-levodopa three times daily shows no noticeable symptom improvement. Increasing the dose aims to  reduce tremors, bradykinesia, and rigidity. The medication is safe with no long-term damage, and higher doses may be necessary as the disease progresses. Early and adequate dosing can preserve functional years, and the highest tolerable dose should be administered early to maximize functional outcomes. - Increase carbidopa-levodopa to 25-100 mg two times a day  Increase carbidopa-levodopa to 1.5 pills three times daily for two weeks, then increase to 2 pills three times daily. - Order Parkinson's-specific Physical Therapy and Speech Therapy   PAIN:  Are you having pain? No  PRECAUTIONS: Fall  RED FLAGS: None   WEIGHT BEARING RESTRICTIONS: No  FALLS: Has patient fallen in last 6 months? No  LIVING ENVIRONMENT: Lives with: lives with their spouse  Lives in: House/apartment Stairs: Yes: External: 3 steps; In process of having railings installed in garage Has following equipment at home: Single point cane  PLOF: Independent  PATIENT GOALS: improve my walking and confidence in getting around.   OBJECTIVE:  Note: Objective measures were completed at Evaluation unless otherwise noted.  DIAGNOSTIC FINDINGS:  Narrative & Impression  CLINICAL DATA:  Neuro deficit, acute, stroke suspected   EXAM: MRI HEAD WITHOUT CONTRAST   TECHNIQUE: Multiplanar, multiecho pulse sequences of the brain and surrounding structures were obtained without intravenous contrast.   COMPARISON:  2012   FINDINGS: Brain: There is no acute infarction or intracranial hemorrhage. There is no intracranial mass, mass effect, or edema. There is no hydrocephalus or extra-axial fluid collection. Ventricles and sulci are within normal limits in size and configuration. Patchy T2 hyperintensity in the supratentorial white matter is nonspecific but may reflect mild chronic microvascular ischemic changes.   Vascular: Major vessel flow voids at the skull base are preserved.   Skull and upper cervical spine: Normal  marrow signal is preserved.   Sinuses/Orbits: Paranasal sinus mucosal thickening. Orbits are unremarkable.   Other: Sella is unremarkable.  Mastoid air cells are clear.   IMPRESSION: No acute infarction, hemorrhage, or mass.   Mild chronic microvascular ischemic changes.     Electronically Signed   By: Guadlupe Spanish M.D.   On: 12/28/2021 13:07      COGNITION: Overall cognitive status: Within functional limits for tasks assessed   SENSATION: WFL  COORDINATION: Slow to initiate at times  EDEMA:  None observed    POSTURE: rounded shoulders and forward head  LOWER EXTREMITY ROM:     Active  Right Eval Left Eval  Hip flexion    Hip extension    Hip abduction    Hip adduction    Hip internal rotation    Hip external rotation    Knee flexion    Knee extension    Ankle dorsiflexion    Ankle plantarflexion    Ankle inversion    Ankle eversion     (Blank rows = not tested)  LOWER EXTREMITY MMT:    MMT Right Eval Left Eval  Hip flexion 4 4  Hip extension 4 4  Hip abduction 4 4  Hip adduction    Hip internal rotation 4 4  Hip external rotation 4 4  Knee flexion 4 4  Knee extension 4 4  Ankle dorsiflexion 4 4  Ankle plantarflexion    Ankle inversion    Ankle eversion    (Blank rows = not tested)  BED MOBILITY:  NOT TESTED- Patient reports independent  TRANSFERS: Assistive device utilized: None  Sit to stand: Complete Independence Stand to sit: Complete Independence Chair to chair: Complete Independence Floor:  Not tested  GAIT: Gait pattern: decreased arm swing- Right, decreased arm swing- Left, decreased step length- Right, decreased step length- Left, decreased stance time- Right, decreased stance time- Left, decreased stride length, shuffling, festinating, poor foot clearance- Right, and poor foot clearance- Left Distance walked: > 100 feet Assistive device utilized:  has cane today but did not use- states only using in unfamiliar community  settings Level of assistance: CGA   FUNCTIONAL TESTS:  5 times sit to stand: 18.81 sec without UE support Timed up and go (TUG): 16.03 sec avg without AD 6 minute walk test: To be assessed next visit 10 meter walk test: 0.79 m/s avg without AD Berg Balance Scale: 43/56  PATIENT SURVEYS:  ABC scale 75.6%  TREATMENT DATE: 10/03/23     PATIENT EDUCATION: Education details: Purpose of PT; Characteristics associated with Parkinsons; plan of care; purpose of functional outcome measures Person educated: Patient and Spouse Education method: Explanation and Demonstration Education comprehension: verbalized understanding, returned demonstration, and verbal cues required  HOME EXERCISE PROGRAM: To be initiated next 1-2 visits  GOALS: Goals reviewed with patient? Yes  SHORT TERM GOALS: Target date: 11/14/2023  Pt will be independent with HEP in order to improve strength and balance in order to decrease fall risk and improve function at home and work. Baseline: Goal status: INITIAL   LONG TERM GOALS: Target date: 12/26/2023  1.  Patient (> 70 years old) will complete five times sit to stand test in < 15 seconds indicating an increased LE strength and improved balance. Baseline: EVAL= 18.81 sec without UE Goal status: INITIAL  2.  Patient will improve ABC score to >80%   to demonstrate statistically significant improvement in mobility and quality of life as it relates to their confidence in his balance.  Baseline: EVAL=75.6 % Goal status: INITIAL   4.  Patient will increase Berg Balance score by > 6 points to demonstrate decreased fall risk during functional activities. Baseline: EVAL= 43/56 Goal status: INITIAL   5.   Patient will reduce timed up and go to <11 seconds to reduce fall risk and demonstrate improved transfer/gait ability. Baseline: EVAL= 16.03  without AD Goal status: INITIAL  6.   Patient will increase 10 meter walk test to >1.38m/s as to improve gait speed for better community ambulation and to reduce fall risk. Baseline: EVAL= 0.79 m/s avg without AD Goal status: INITIAL  7.   Patient will increase six minute walk test distance to >1000 for progression to community ambulator and improve gait ability Baseline: EVAL: To be tested next visit Goal status: INITIAL    ASSESSMENT:  CLINICAL IMPRESSION: Patient is a 78 y.o. Male who was seen today for physical therapy evaluation and treatment for Parkinsons. He presents with decreased overall confidence in balance and impairment scoring 43/56 on BERG. He presents with mild LE muscle weakness and stiff trunk posture with forward trunk and shuffling gait with decreased speed placing him at increased risk of falling. He will benefit from skilled PT services to improve his functional strength and mobility and overall balance for optimal quality of life and decreased risk of falling.   OBJECTIVE IMPAIRMENTS: Abnormal gait, decreased balance, decreased coordination, decreased endurance, decreased mobility, difficulty walking, decreased ROM, decreased strength, hypomobility, impaired flexibility, and postural dysfunction.   ACTIVITY LIMITATIONS: carrying, lifting, bending, standing, squatting, stairs, and transfers  PARTICIPATION LIMITATIONS: cleaning, shopping, community activity, and yard work  PERSONAL FACTORS: 1-2 comorbidities: HTN  are also affecting patient's functional outcome.   REHAB POTENTIAL: Good  CLINICAL DECISION MAKING: Stable/uncomplicated  EVALUATION COMPLEXITY: Low  PLAN:  PT FREQUENCY: 1-2x/week  PT DURATION: 12 weeks  PLANNED INTERVENTIONS: 97164- PT Re-evaluation, 97110-Therapeutic exercises, 97530- Therapeutic activity, [id number]- Neuromuscular re-education, 97535- Self Care, 29562- Manual therapy, [id number]- Gait training, [phone]- Orthotic Fit/training, [phone]-  Canalith repositioning, [phone]- Electrical stimulation (manual), Patient/Family education, Balance training, Stair training, Taping, Dry Needling, Joint mobilization, Joint manipulation, Spinal manipulation, Spinal mobilization, Vestibular training, DME instructions, Cryotherapy, and Moist heat  PLAN FOR NEXT SESSION: 6 min walk- Revise goal as necessary. Implement Theract/therex/balance training as well as postural strengthening/trunk and LE ROM to assist with flexibility. Add to HEP as appropriate.   Lenda Kelp, PT 10/03/2023, 7:25 AM

## 2023-10-05 ENCOUNTER — Ambulatory Visit

## 2023-10-05 DIAGNOSIS — R278 Other lack of coordination: Secondary | ICD-10-CM

## 2023-10-05 DIAGNOSIS — R471 Dysarthria and anarthria: Secondary | ICD-10-CM

## 2023-10-05 DIAGNOSIS — R262 Difficulty in walking, not elsewhere classified: Secondary | ICD-10-CM

## 2023-10-05 DIAGNOSIS — M6281 Muscle weakness (generalized): Secondary | ICD-10-CM

## 2023-10-05 DIAGNOSIS — G20A1 Parkinson's disease without dyskinesia, without mention of fluctuations: Secondary | ICD-10-CM

## 2023-10-05 NOTE — Therapy (Signed)
 OUTPATIENT PHYSICAL THERAPY NEURO TREATMENT   Patient Name: Derrick Bishop MRN: [id number] DOB:Sep 15, 1945, 78 y.o., male Today's Date: 10/05/2023   PCP: Dr. Bethann Punches REFERRING PROVIDER: Dr. Cristopher Peru  END OF SESSION:  PT End of Session - 10/05/23 1306     Visit Number 2    Number of Visits 24    Date for PT Re-Evaluation 12/26/23    PT Start Time 1403    PT Stop Time 1444    PT Time Calculation (min) 41 min    Equipment Utilized During Treatment Gait belt    Activity Tolerance Patient tolerated treatment well    Behavior During Therapy WFL for tasks assessed/performed             Past Medical History:  Diagnosis Date   Hypertension    History reviewed. No pertinent surgical history. There are no active problems to display for this patient.   ONSET DATE: 3 years ago  REFERRING DIAG: Parkinson's disease without dyskinesia, without mention of fluctuations   THERAPY DIAG:  Other lack of coordination  Difficulty in walking, not elsewhere classified  Muscle weakness (generalized)  Parkinson's disease without dyskinesia or fluctuating manifestations (HCC)  Rationale for Evaluation and Treatment: Rehabilitation  SUBJECTIVE:                                                                                                                                                                                             SUBJECTIVE STATEMENT: Pt reports no aches/pains and no stumbles/falls. He reports looking over his should while walking can be tricky for balance/he's careful.  He has difficulty with walking up/down stairs; has to adjust after he has been sitting for a while.    Pt accompanied by: significant other- Joyce  PERTINENT HISTORY: History obtained by Dr. Sherryll Burger on 09/15/2023 Assessment & Plan Parkinson's Disease in a patient with bilateral hand tremor (left > right) + slowness of movement + some changes in balance + hypophonia + micro graphia + hypomimia +  minimal bradykinesia on the left + Mild cog wheeling on the right and moderate cog wheeling on the left + decreased arm swing on the left + slight hunched forward posture - symptomatic, without new or worsening features   Parkinson's disease with worsening symptoms, including increased hand tremors, balance problems, and freezing episodes. Current treatment with carbidopa-levodopa three times daily shows no noticeable symptom improvement. Increasing the dose aims to reduce tremors, bradykinesia, and rigidity. The medication is safe with no long-term damage, and higher doses may be necessary as the disease progresses. Early and adequate dosing can preserve functional years, and the highest  tolerable dose should be administered early to maximize functional outcomes. - Increase carbidopa-levodopa to 25-100 mg two times a day  Increase carbidopa-levodopa to 1.5 pills three times daily for two weeks, then increase to 2 pills three times daily. - Order Parkinson's-specific Physical Therapy and Speech Therapy   PAIN:  Are you having pain? No  PRECAUTIONS: Fall  RED FLAGS: None   WEIGHT BEARING RESTRICTIONS: No  FALLS: Has patient fallen in last 6 months? No  LIVING ENVIRONMENT: Lives with: lives with their spouse Lives in: House/apartment Stairs: Yes: External: 3 steps; In process of having railings installed in garage Has following equipment at home: Single point cane  PLOF: Independent  PATIENT GOALS: improve my walking and confidence in getting around.   OBJECTIVE:  Note: Objective measures were completed at Evaluation unless otherwise noted.  DIAGNOSTIC FINDINGS:  Narrative & Impression  CLINICAL DATA:  Neuro deficit, acute, stroke suspected   EXAM: MRI HEAD WITHOUT CONTRAST   TECHNIQUE: Multiplanar, multiecho pulse sequences of the brain and surrounding structures were obtained without intravenous contrast.   COMPARISON:  2012   FINDINGS: Brain: There is no acute  infarction or intracranial hemorrhage. There is no intracranial mass, mass effect, or edema. There is no hydrocephalus or extra-axial fluid collection. Ventricles and sulci are within normal limits in size and configuration. Patchy T2 hyperintensity in the supratentorial white matter is nonspecific but may reflect mild chronic microvascular ischemic changes.   Vascular: Major vessel flow voids at the skull base are preserved.   Skull and upper cervical spine: Normal marrow signal is preserved.   Sinuses/Orbits: Paranasal sinus mucosal thickening. Orbits are unremarkable.   Other: Sella is unremarkable.  Mastoid air cells are clear.   IMPRESSION: No acute infarction, hemorrhage, or mass.   Mild chronic microvascular ischemic changes.     Electronically Signed   By: Guadlupe Spanish M.D.   On: 12/28/2021 13:07      COGNITION: Overall cognitive status: Within functional limits for tasks assessed   SENSATION: WFL  COORDINATION: Slow to initiate at times  EDEMA:  None observed    POSTURE: rounded shoulders and forward head  LOWER EXTREMITY ROM:     Active  Right Eval Left Eval  Hip flexion    Hip extension    Hip abduction    Hip adduction    Hip internal rotation    Hip external rotation    Knee flexion    Knee extension    Ankle dorsiflexion    Ankle plantarflexion    Ankle inversion    Ankle eversion     (Blank rows = not tested)  LOWER EXTREMITY MMT:    MMT Right Eval Left Eval  Hip flexion 4 4  Hip extension 4 4  Hip abduction 4 4  Hip adduction    Hip internal rotation 4 4  Hip external rotation 4 4  Knee flexion 4 4  Knee extension 4 4  Ankle dorsiflexion 4 4  Ankle plantarflexion    Ankle inversion    Ankle eversion    (Blank rows = not tested)  BED MOBILITY:  NOT TESTED- Patient reports independent  TRANSFERS: Assistive device utilized: None  Sit to stand: Complete Independence Stand to sit: Complete Independence Chair to  chair: Complete Independence Floor:  Not tested  GAIT: Gait pattern: decreased arm swing- Right, decreased arm swing- Left, decreased step length- Right, decreased step length- Left, decreased stance time- Right, decreased stance time- Left, decreased stride length, shuffling, festinating, poor  foot clearance- Right, and poor foot clearance- Left Distance walked: > 100 feet Assistive device utilized:  has cane today but did not use- states only using in unfamiliar community settings Level of assistance: CGA   FUNCTIONAL TESTS:  5 times sit to stand: 18.81 sec without UE support Timed up and go (TUG): 16.03 sec avg without AD 6 minute walk test: 937 ftt 10 meter walk test: 0.79 m/s avg without AD Berg Balance Scale: 43/56  PATIENT SURVEYS:  ABC scale 75.6%                                                                                                                              TREATMENT DATE: 10/05/23     Physical Performance: 6 Min Walk Test:  Instructed patient to ambulate as quickly and as safely as possible for 6 minutes using LRAD. Patient was allowed to take standing rest breaks without stopping the test, but if the patient required a sitting rest break the clock would be stopped and the test would be over.  Results: 937 feet using a SPC. Results indicate that the patient has reduced endurance with ambulation compared to age matched norms.  Age Matched Norms (in meters): 16-69 yo M: 36 F: 75, 58-79 yo M: 15 F: 471, 37-89 yo M: 417 F: 392 MDC: 58.21 meters (190.98 feet) or 50 meters (ANPTA Core Set of Outcome Measures for Adults with Neurologic Conditions, 2018)  PT reviewed pt score/performance with pt   NMR: PT provided education on purpose and use of large-amplitude technique to override hypokinetic and bradykinetic movements.  PT provided VC/DEMO as well  Application of large-amplitude technique with the following: STS 3x10 with BUE abduction and finger abduction    Finger flicks 20x bilat UE  Review of HEP Access Code: [email or web address] URL: [email or web address] Date: 10/05/2023 Prepared by: Temple Pacini  Exercises - Sit to Stand Without Arm Support  - 1 x daily - 5-6 x weekly - 2-3 sets - 10 reps - Seated Finger Flicks with Elbow Extension  - 1 x daily - 7 x weekly - 2 sets - 20 reps - Walking  - 1 x daily - 7 x weekly - 1 sets - 1 reps - 5 minutes hold   Gait Big Walking with and without SPC x several minutes focus on upright posture, larger steps, and increased arm-swing     PATIENT EDUCATION: Education details: large amplitude technique, HEP,  Person educated: Patient Education method: Medical illustrator, VC printout Education comprehension: verbalized understanding, returned demonstration, and verbal cues required  HOME EXERCISE PROGRAM: Review of HEP Access Code: [email or web address] URL: [email or web address] Date: 10/05/2023 Prepared by: Temple Pacini  Exercises - Sit to Stand Without Arm Support  - 1 x daily - 5-6 x weekly - 2-3 sets - 10 reps - Seated Finger Flicks with Elbow Extension  - 1 x daily - 7 x weekly - 2 sets - 20 reps - Walking  - 1  x daily - 7 x weekly - 1 sets - 1 reps - 5 minutes hold   GOALS: Goals reviewed with patient? Yes  SHORT TERM GOALS: Target date: 11/14/2023  Pt will be independent with HEP in order to improve strength and balance in order to decrease fall risk and improve function at home and work. Baseline: Goal status: INITIAL   LONG TERM GOALS: Target date: 12/26/2023  1.  Patient (> 36 years old) will complete five times sit to stand test in < 15 seconds indicating an increased LE strength and improved balance. Baseline: EVAL= 18.81 sec without UE Goal status: INITIAL  2.  Patient will improve ABC score to >80%   to demonstrate statistically significant improvement in mobility and quality of life as it relates to their confidence in his balance.  Baseline:  EVAL=75.6 % Goal status: INITIAL   4.  Patient will increase Berg Balance score by > 6 points to demonstrate decreased fall risk during functional activities. Baseline: EVAL= 43/56 Goal status: INITIAL   5.   Patient will reduce timed up and go to <11 seconds to reduce fall risk and demonstrate improved transfer/gait ability. Baseline: EVAL= 16.03 without AD Goal status: INITIAL  6.   Patient will increase 10 meter walk test to >1.28m/s as to improve gait speed for better community ambulation and to reduce fall risk. Baseline: EVAL= 0.79 m/s avg without AD Goal status: INITIAL  7.   Patient will increase six minute walk test distance to >1000 for progression to community ambulator and improve gait ability Baseline: EVAL: To be tested next visit; 10/05/23: 937 ft  Goal status: INITIAL    ASSESSMENT:  CLINICAL IMPRESSION: Patient is a 78 y.o. Male who was seen today for physical therapy evaluation and treatment for Parkinsons. Completed this session with performance indicating impairment of gait/functional capacity. PT also initiated HEP and reviewed large-amplitude technique to override hypokinetic and bradykinetic movements. The pt will benefit from skilled PT services to improve his functional strength and mobility and overall balance for optimal quality of life and decreased risk of falling.   OBJECTIVE IMPAIRMENTS: Abnormal gait, decreased balance, decreased coordination, decreased endurance, decreased mobility, difficulty walking, decreased ROM, decreased strength, hypomobility, impaired flexibility, and postural dysfunction.   ACTIVITY LIMITATIONS: carrying, lifting, bending, standing, squatting, stairs, and transfers  PARTICIPATION LIMITATIONS: cleaning, shopping, community activity, and yard work  PERSONAL FACTORS: 1-2 comorbidities: HTN  are also affecting patient's functional outcome.   REHAB POTENTIAL: Good  CLINICAL DECISION MAKING:  Stable/uncomplicated  EVALUATION COMPLEXITY: Low  PLAN:  PT FREQUENCY: 1-2x/week  PT DURATION: 12 weeks  PLANNED INTERVENTIONS: 97164- PT Re-evaluation, 97110-Therapeutic exercises, 97530- Therapeutic activity, [id number]- Neuromuscular re-education, 97535- Self Care, 16109- Manual therapy, [id number]- Gait training, [phone]- Orthotic Fit/training, [phone]- Canalith repositioning, [phone]- Electrical stimulation (manual), Patient/Family education, Balance training, Stair training, Taping, Dry Needling, Joint mobilization, Joint manipulation, Spinal manipulation, Spinal mobilization, Vestibular training, DME instructions, Cryotherapy, and Moist heat  PLAN FOR NEXT SESSION: Theract/therex/balance training as well as postural strengthening/trunk and LE ROM to assist with flexibility. Add to HEP as appropriate.   Baird Kay, PT 10/05/2023, 5:13 PM

## 2023-10-05 NOTE — Therapy (Addendum)
 OUTPATIENT SPEECH LANGUAGE PATHOLOGY PARKINSON'S TREATMENT   Patient Name: Derrick Bishop MRN: [id number] DOB:07/21/1945, 78 y.o., male Today's Date: 10/05/2023  PCP: Firman Hughes, MD REFERRING PROVIDER: Devora Folks, MD   End of Session - 10/05/23 1312     Visit Number 2    Number of Visits 24    Date for SLP Re-Evaluation 12/26/23    SLP Start Time 1315    SLP Stop Time  1355    SLP Time Calculation (min) 40 min    Activity Tolerance Patient tolerated treatment well             Past Medical History:  Diagnosis Date   Hypertension    No past surgical history on file. There are no active problems to display for this patient.   ONSET DATE:  ~3 years ago;   REFERRING DIAG: Parkinson's Disease without dyskinesia, without mention of fluctuations  THERAPY DIAG:  Dysarthria and anarthria  Rationale for Evaluation and Treatment Rehabilitation  SUBJECTIVE:   SUBJECTIVE STATEMENT: Pt alert, pleasant, and cooperative. Pt accompanied by: self  PERTINENT HISTORY: Per Neuro note, 09/15/23, "Parkinson's Disease in a patient with bilateral hand tremor (left > right) + slowness of movement + some changes in balance + hypophonia + micro graphia + hypomimia + minimal bradykinesia on the left + Mild cog wheeling on the right and moderate cog wheeling on the left + decreased arm swing on the left + slight hunched forward posture - symptomatic, without new or worsening features." Pt with hx HTN, HLD, Vit B12 and Vit deficiencies. MRI brain, 12/28/21, "no a cute infarction, hemorrhage, or mass. Mild chronic microvascular ischemic changes."    PAIN:  Are you having pain? No   FALLS: Has patient fallen in last 6 months? Defer to PT  LIVING ENVIRONMENT: Lives with: lives with their spouse Lives in: House/apartment  PLOF:  ambulates with single point cane; driving; indep  PATIENT GOALS  for speech to improve  OBJECTIVE:  TODAY'S TREATMENT: Cognitive-linguistic screening: Pt  scored 29/30 on Mini ACE ([place] Cognitive Examination) which is [facility].   Voice ex's completed as follows: Sustained /a/: x10, ~84dB Ascending pitch glides: x10, ~83dB Descending pitch glides: x10, ~85db  Pt read x10 functional phrases twice through with loud and intentional voice, averaging 83dB. Co-created list of x8 functional phrases for HEP.  During short conversational exchanges, pt average 73-74 dB with evidence of volume decay. Improved to 76dB during paragraph reading task.  Pt benefit from use of biofeeback using Voice Analyst app.   Pt educated re: results of cognitive-linguistic screening, changes to speech/voice due to Parkinson's Disease, SLP POC, and HEP.  PATIENT EDUCATION: Education details: as above Person educated: Patient  Education method: Programmer, multimedia; Handout Education comprehension: verbalized understanding and needs further education   HOME EXERCISE PROGRAM: Voice Ex's and Functional Reading as outlined above  GOALS: Goals reviewed with patient? Yes  SHORT TERM GOALS: Target date: 10 sessions  Pt will participate in further assessment of functional cognitive-linguistic ability with additional goals to be set as appropriate. Baseline: Goal status: INITIAL  2.  The patient will complete HEP (Maximum duration "ah", High/Lows, and Functional Phrases) at average loudness >/= 80 dB and with loud, good quality voice with min cues.  Baseline: Goal status: INITIAL  3.  Pt will sustain conversational loudness (75-85 dB) for 3-5 minutes of conversation with min cues. Baseline:  Goal status: INITIAL    LONG TERM GOALS: Target date: 12 weeks  The patient will complete HEP (  Maximum duration "ah", High/Lows, and Functional Phrases) at average loudness >/= 80 dB and with loud, good quality voice indep. Baseline: Goal status: INITIAL  Pt will sustain conversational loudness (75-85 dB) for 8-10 minutes of conversation with min cues. Baseline:  Goal status:  INITIAL   ASSESSMENT:  CLINICAL IMPRESSION: Patient is a 78 y.o. male who was seen today for evaluation in setting of Parkinson's Disease. er Neuro note, 09/15/23, "Parkinson's Disease in a patient with bilateral hand tremor (left > right) + slowness of movement + some changes in balance + hypophonia + micro graphia + hypomimia + minimal bradykinesia on the left + Mild cog wheeling on the right and moderate cog wheeling on the left + decreased arm swing on the left + slight hunched forward posture - symptomatic, without new or worsening features." Pt taking carbidopa-levodopa for Parkinson's Disease with recent increased in dosage.  Pt with hx HTN, HLD, Vit B12 and Vit deficiencies. MRI brain, 12/28/21, "no a cute infarction, hemorrhage, or mass. Mild chronic microvascular ischemic changes." Pt presents with a mild hypokinetic dysarthric c/b hypophonia, mildly hoarse vocal quality and intermittent fast rushes of speech. Evidence of volume decay noted. Wife endorsed that speech is worse in home with pt often "muttering." Wife also identified x2 persons who have stopped communicating with pt due to difficulty hearing him. Pt stimulable to verbal cues and would benefit from course of ST to improve pt's ability to communicate at home and in the community.  OBJECTIVE IMPAIRMENTS include dysarthria. These impairments are limiting patient from effectively communicating at home and in community. Factors affecting potential to achieve goals and functional outcome are n/a. Patient will benefit from skilled SLP services to address above impairments and improve overall function.  REHAB POTENTIAL: Good  PLAN: SLP FREQUENCY: 1-2x/week  SLP DURATION: 12 weeks  PLANNED INTERVENTIONS: Functional tasks, Multimodal communication approach, SLP instruction and feedback, Compensatory strategies, Patient/family education, and further cognitive-linguistic assessment.   Dia Forget, M.S., CCC-SLP Speech-Language  Pathologist [place] [facility] [phone] Rogers Clayman)   [place] [facility] [address] [place], [place], [address] Phone: [phone]   Fax:  [phone]

## 2023-10-11 ENCOUNTER — Ambulatory Visit: Admitting: Physical Therapy

## 2023-10-11 ENCOUNTER — Ambulatory Visit

## 2023-10-11 DIAGNOSIS — R269 Unspecified abnormalities of gait and mobility: Secondary | ICD-10-CM

## 2023-10-11 DIAGNOSIS — R262 Difficulty in walking, not elsewhere classified: Secondary | ICD-10-CM

## 2023-10-11 DIAGNOSIS — G20A1 Parkinson's disease without dyskinesia, without mention of fluctuations: Secondary | ICD-10-CM

## 2023-10-11 DIAGNOSIS — M6281 Muscle weakness (generalized): Secondary | ICD-10-CM

## 2023-10-11 DIAGNOSIS — R278 Other lack of coordination: Secondary | ICD-10-CM

## 2023-10-11 DIAGNOSIS — R471 Dysarthria and anarthria: Secondary | ICD-10-CM | POA: Diagnosis not present

## 2023-10-11 NOTE — Therapy (Signed)
 OUTPATIENT SPEECH LANGUAGE PATHOLOGY PARKINSON'S TREATMENT   Patient Name: Derrick Bishop MRN: [id number] DOB:05-31-1946, 78 y.o., male Today's Date: 10/11/2023  PCP: Firman Hughes, MD REFERRING PROVIDER: Devora Folks, MD   End of Session - 10/11/23 1009     Visit Number 3    Number of Visits 24    Date for SLP Re-Evaluation 12/26/23    SLP Start Time 1015    SLP Stop Time  1100    SLP Time Calculation (min) 45 min    Activity Tolerance Patient tolerated treatment well             Past Medical History:  Diagnosis Date   Hypertension    No past surgical history on file. There are no active problems to display for this patient.   ONSET DATE:  ~3 years ago;   REFERRING DIAG: Parkinson's Disease without dyskinesia, without mention of fluctuations  THERAPY DIAG:  Dysarthria and anarthria  Rationale for Evaluation and Treatment Rehabilitation  SUBJECTIVE:   SUBJECTIVE STATEMENT: Pt alert, pleasant, and cooperative. Pt accompanied by: self  PERTINENT HISTORY: Per Neuro note, 09/15/23, "Parkinson's Disease in a patient with bilateral hand tremor (left > right) + slowness of movement + some changes in balance + hypophonia + micro graphia + hypomimia + minimal bradykinesia on the left + Mild cog wheeling on the right and moderate cog wheeling on the left + decreased arm swing on the left + slight hunched forward posture - symptomatic, without new or worsening features." Pt with hx HTN, HLD, Vit B12 and Vit deficiencies. MRI brain, 12/28/21, "no a cute infarction, hemorrhage, or mass. Mild chronic microvascular ischemic changes."    PAIN:  Are you having pain? No   FALLS: Has patient fallen in last 6 months? Defer to PT  LIVING ENVIRONMENT: Lives with: lives with their spouse Lives in: House/apartment  PLOF:  ambulates with single point cane; driving; indep  PATIENT GOALS  for speech to improve  OBJECTIVE:  TODAY'S TREATMENT:  Introduced EMST including purpose,  how to complete ex's, and maintenance. Initial training threshold of 24cmH2O obtained. Pt completed 5 sets of 5 reps with min/mod verbal/visual cueing initially, decreasing to rare verbal cues.   Voice ex's completed as follows: Sustained /a/: x10, ~84dB Ascending pitch glides: x10, ~83dB Descending pitch glides: x10, ~85db  Pt read x8 functional phrases twice through with loud and intentional voice, averaging 80dB.   During short conversational exchanges, pt average 73-74 dB with evidence of volume decay.   Pt benefit from use of biofeeback using Voice Analyst app.   Pt educated re: changes to speech/voice due to Parkinson's Disease, voice ex's, EMST, SLP POC, and HEP.  PATIENT EDUCATION: Education details: as above Person educated: Patient  Education method: Programmer, multimedia; Handout Education comprehension: verbalized understanding and needs further education   HOME EXERCISE PROGRAM: Voice Ex's and Functional Reading as outlined above  GOALS: Goals reviewed with patient? Yes  SHORT TERM GOALS: Target date: 10 sessions  Pt will participate in further assessment of functional cognitive-linguistic ability with additional goals to be set as appropriate. Baseline: Goal status: INITIAL  2.  The patient will complete HEP (Maximum duration "ah", High/Lows, and Functional Phrases) at average loudness >/= 80 dB and with loud, good quality voice with min cues.  Baseline: Goal status: INITIAL  3.  Pt will sustain conversational loudness (75-85 dB) for 3-5 minutes of conversation with min cues. Baseline:  Goal status: INITIAL    LONG TERM GOALS: Target date: 12 weeks  The patient will complete HEP (Maximum duration "ah", High/Lows, and Functional Phrases) at average loudness >/= 80 dB and with loud, good quality voice indep. Baseline: Goal status: INITIAL  Pt will sustain conversational loudness (75-85 dB) for 8-10 minutes of conversation with min cues. Baseline:  Goal status:  INITIAL   ASSESSMENT:  CLINICAL IMPRESSION: Patient is a 78 y.o. male who was seen today for evaluation in setting of Parkinson's Disease. er Neuro note, 09/15/23, "Parkinson's Disease in a patient with bilateral hand tremor (left > right) + slowness of movement + some changes in balance + hypophonia + micro graphia + hypomimia + minimal bradykinesia on the left + Mild cog wheeling on the right and moderate cog wheeling on the left + decreased arm swing on the left + slight hunched forward posture - symptomatic, without new or worsening features." Pt taking carbidopa-levodopa for Parkinson's Disease with recent increased in dosage.  Pt with hx HTN, HLD, Vit B12 and Vit deficiencies. MRI brain, 12/28/21, "no a cute infarction, hemorrhage, or mass. Mild chronic microvascular ischemic changes." Pt presents with a mild hypokinetic dysarthric c/b hypophonia, mildly hoarse vocal quality and intermittent fast rushes of speech. Evidence of volume decay noted. Wife endorsed that speech is worse in home with pt often "muttering." Wife also identified x2 persons who have stopped communicating with pt due to difficulty hearing him. Pt stimulable to verbal cues and would benefit from course of ST to improve pt's ability to communicate at home and in the community.  OBJECTIVE IMPAIRMENTS include dysarthria. These impairments are limiting patient from effectively communicating at home and in community. Factors affecting potential to achieve goals and functional outcome are n/a. Patient will benefit from skilled SLP services to address above impairments and improve overall function.  REHAB POTENTIAL: Good  PLAN: SLP FREQUENCY: 1-2x/week  SLP DURATION: 12 weeks  PLANNED INTERVENTIONS: Functional tasks, Multimodal communication approach, SLP instruction and feedback, Compensatory strategies, Patient/family education, and further cognitive-linguistic assessment.   Dia Forget, M.S., CCC-SLP Speech-Language  Pathologist [place] [facility] [phone] Rogers Clayman)   [facility] at [facility] [address] [place], [place], [address] Phone: [phone]   Fax:  [phone]

## 2023-10-11 NOTE — Therapy (Signed)
 OUTPATIENT PHYSICAL THERAPY NEURO TREATMENT   Patient Name: Derrick Bishop MRN: [id number] DOB:07-31-1945, 78 y.o., male Today's Date: 10/11/2023   PCP: Dr. Firman Hughes REFERRING PROVIDER: Dr. Devora Folks  END OF SESSION:  PT End of Session - 10/11/23 0914     Visit Number 3    Number of Visits 24    Date for PT Re-Evaluation 12/26/23    Progress Note Due on Visit 10    PT Start Time 0933    PT Stop Time 1015    PT Time Calculation (min) 42 min    Equipment Utilized During Treatment Gait belt    Activity Tolerance Patient tolerated treatment well    Behavior During Therapy WFL for tasks assessed/performed             Past Medical History:  Diagnosis Date   Hypertension    No past surgical history on file. There are no active problems to display for this patient.   ONSET DATE: 3 years ago  REFERRING DIAG: Parkinson's disease without dyskinesia, without mention of fluctuations   THERAPY DIAG:  No diagnosis found.  Rationale for Evaluation and Treatment: Rehabilitation  SUBJECTIVE:                                                                                                                                                                                             SUBJECTIVE STATEMENT: Pt reports doing well today. Pt denies any recent falls/stumbles since prior session. Pt denies any updates to medications or medical appointment since prior session. Pt reports good compliance with HEP when time permits.     Pt accompanied by: significant other- Joyce  PERTINENT HISTORY: History obtained by Dr. Mason Sole on 09/15/2023 Assessment & Plan Parkinson's Disease in a patient with bilateral hand tremor (left > right) + slowness of movement + some changes in balance + hypophonia + micro graphia + hypomimia + minimal bradykinesia on the left + Mild cog wheeling on the right and moderate cog wheeling on the left + decreased arm swing on the left + slight hunched forward  posture - symptomatic, without new or worsening features   Parkinson's disease with worsening symptoms, including increased hand tremors, balance problems, and freezing episodes. Current treatment with carbidopa-levodopa three times daily shows no noticeable symptom improvement. Increasing the dose aims to reduce tremors, bradykinesia, and rigidity. The medication is safe with no long-term damage, and higher doses may be necessary as the disease progresses. Early and adequate dosing can preserve functional years, and the highest tolerable dose should be administered early to maximize functional outcomes. - Increase carbidopa-levodopa to 25-100 mg two  times a day  Increase carbidopa-levodopa to 1.5 pills three times daily for two weeks, then increase to 2 pills three times daily. - Order Parkinson's-specific Physical Therapy and Speech Therapy   PAIN:  Are you having pain? No  PRECAUTIONS: Fall  RED FLAGS: None   WEIGHT BEARING RESTRICTIONS: No  FALLS: Has patient fallen in last 6 months? No  LIVING ENVIRONMENT: Lives with: lives with their spouse Lives in: House/apartment Stairs: Yes: External: 3 steps; In process of having railings installed in garage Has following equipment at home: Single point cane  PLOF: Independent  PATIENT GOALS: improve my walking and confidence in getting around.   OBJECTIVE:  Note: Objective measures were completed at Evaluation unless otherwise noted.  DIAGNOSTIC FINDINGS:  Narrative & Impression  CLINICAL DATA:  Neuro deficit, acute, stroke suspected   EXAM: MRI HEAD WITHOUT CONTRAST   TECHNIQUE: Multiplanar, multiecho pulse sequences of the brain and surrounding structures were obtained without intravenous contrast.   COMPARISON:  2012   FINDINGS: Brain: There is no acute infarction or intracranial hemorrhage. There is no intracranial mass, mass effect, or edema. There is no hydrocephalus or extra-axial fluid collection. Ventricles and  sulci are within normal limits in size and configuration. Patchy T2 hyperintensity in the supratentorial white matter is nonspecific but may reflect mild chronic microvascular ischemic changes.   Vascular: Major vessel flow voids at the skull base are preserved.   Skull and upper cervical spine: Normal marrow signal is preserved.   Sinuses/Orbits: Paranasal sinus mucosal thickening. Orbits are unremarkable.   Other: Sella is unremarkable.  Mastoid air cells are clear.   IMPRESSION: No acute infarction, hemorrhage, or mass.   Mild chronic microvascular ischemic changes.     Electronically Signed   By: Guadlupe Spanish M.D.   On: 12/28/2021 13:07      COGNITION: Overall cognitive status: Within functional limits for tasks assessed   SENSATION: WFL  COORDINATION: Slow to initiate at times  EDEMA:  None observed    POSTURE: rounded shoulders and forward head  LOWER EXTREMITY ROM:     Active  Right Eval Left Eval  Hip flexion    Hip extension    Hip abduction    Hip adduction    Hip internal rotation    Hip external rotation    Knee flexion    Knee extension    Ankle dorsiflexion    Ankle plantarflexion    Ankle inversion    Ankle eversion     (Blank rows = not tested)  LOWER EXTREMITY MMT:    MMT Right Eval Left Eval  Hip flexion 4 4  Hip extension 4 4  Hip abduction 4 4  Hip adduction    Hip internal rotation 4 4  Hip external rotation 4 4  Knee flexion 4 4  Knee extension 4 4  Ankle dorsiflexion 4 4  Ankle plantarflexion    Ankle inversion    Ankle eversion    (Blank rows = not tested)  BED MOBILITY:  NOT TESTED- Patient reports independent  TRANSFERS: Assistive device utilized: None  Sit to stand: Complete Independence Stand to sit: Complete Independence Chair to chair: Complete Independence Floor:  Not tested  GAIT: Gait pattern: decreased arm swing- Right, decreased arm swing- Left, decreased step length- Right, decreased step  length- Left, decreased stance time- Right, decreased stance time- Left, decreased stride length, shuffling, festinating, poor foot clearance- Right, and poor foot clearance- Left Distance walked: > 100 feet Assistive device utilized:  has cane today but did not use- states only using in unfamiliar community settings Level of assistance: CGA   FUNCTIONAL TESTS:  5 times sit to stand: 18.81 sec without UE support Timed up and go (TUG): 16.03 sec avg without AD 6 minute walk test: 937 ftt 10 meter walk test: 0.79 m/s avg without AD Berg Balance Scale: 43/56  PATIENT SURVEYS:  ABC scale 75.6%                                                                                                                              TREATMENT DATE: 10/11/23       NMR: Octane level 2 x 6 min for B UE and LE reciprocal movements   Seated PWR! Moves 2 x 10 ea   -PWR! Up with finger flick hand boost on opening portion   -PWR! Rock   - Twist   -Step, second set challenged to take multiple large steps to get 90 degree turn   Standing PWR! Moves   - PWR! Step 2 x 10 ea direction with U UE assist and cues for stomp   - PWR! Rock 3 x 10 ea side with gradually increasing distance of reach with external target each round    - Gait with arm swing focus and metronome with intermittent stopping and going to work on adapting to freezing gait. X 6 min, most difficult with turning, will try and implement more turning in future interventions   PWR! Up targets postural strengthening and antigravity extension, PRW! Rock targets functional weight shifting, PRW! Twist targets trunk rotation and PRW! Step targets transition movements. PWR! Moves target bradykinesia, rigidity, and dyskinesia through targeted functional movements that address four core movement difficulties for people with Parkinson's disease.       PATIENT EDUCATION: Education details: large amplitude technique, HEP,  Person educated:  Patient Education method: Medical illustrator, VC printout Education comprehension: verbalized understanding, returned demonstration, and verbal cues required  HOME EXERCISE PROGRAM: Review of HEP Access Code: [email or web address] URL: [email or web address] Date: 10/05/2023 Prepared by: Aminta Kales  Exercises - Sit to Stand Without Arm Support  - 1 x daily - 5-6 x weekly - 2-3 sets - 10 reps - Seated Finger Flicks with Elbow Extension  - 1 x daily - 7 x weekly - 2 sets - 20 reps - Walking  - 1 x daily - 7 x weekly - 1 sets - 1 reps - 5 minutes hold   GOALS: Goals reviewed with patient? Yes  SHORT TERM GOALS: Target date: 11/14/2023  Pt will be independent with HEP in order to improve strength and balance in order to decrease fall risk and improve function at home and work. Baseline: Goal status: INITIAL   LONG TERM GOALS: Target date: 12/26/2023  1.  Patient (> 30 years old) will complete five times sit to stand test in < 15 seconds indicating an increased LE strength and improved balance. Baseline: EVAL= 18.81  sec without UE Goal status: INITIAL  2.  Patient will improve ABC score to >80%   to demonstrate statistically significant improvement in mobility and quality of life as it relates to their confidence in his balance.  Baseline: EVAL=75.6 % Goal status: INITIAL   4.  Patient will increase Berg Balance score by > 6 points to demonstrate decreased fall risk during functional activities. Baseline: EVAL= 43/56 Goal status: INITIAL   5.   Patient will reduce timed up and go to <11 seconds to reduce fall risk and demonstrate improved transfer/gait ability. Baseline: EVAL= 16.03 without AD Goal status: INITIAL  6.   Patient will increase 10 meter walk test to >1.33m/s as to improve gait speed for better community ambulation and to reduce fall risk. Baseline: EVAL= 0.79 m/s avg without AD Goal status: INITIAL  7.   Patient will increase six minute walk test  distance to >1000 for progression to community ambulator and improve gait ability Baseline: EVAL: To be tested next visit; 10/05/23: 937 ft  Goal status: INITIAL    ASSESSMENT:  CLINICAL IMPRESSION: Patient arrived with good motivation for completion of pt activities. Pt challenged with PWR! Moves this date. PWR! Up targets postural strengthening and antigravity extension, PRW! Rock targets functional weight shifting, PRW! Twist targets trunk rotation and PRW! Step targets transition movements. PWR! Moves target bradykinesia, rigidity, and dyskinesia through targeted functional movements that address four core movement difficulties for people with Parkinson's disease. Pt had most difficulty with seated PWR! Step and other transverse plane movements. Pt will continue to benefit from skilled physical therapy intervention to address impairments, improve QOL, and attain therapy goals.    OBJECTIVE IMPAIRMENTS: Abnormal gait, decreased balance, decreased coordination, decreased endurance, decreased mobility, difficulty walking, decreased ROM, decreased strength, hypomobility, impaired flexibility, and postural dysfunction.   ACTIVITY LIMITATIONS: carrying, lifting, bending, standing, squatting, stairs, and transfers  PARTICIPATION LIMITATIONS: cleaning, shopping, community activity, and yard work  PERSONAL FACTORS: 1-2 comorbidities: HTN  are also affecting patient's functional outcome.   REHAB POTENTIAL: Good  CLINICAL DECISION MAKING: Stable/uncomplicated  EVALUATION COMPLEXITY: Low  PLAN:  PT FREQUENCY: 1-2x/week  PT DURATION: 12 weeks  PLANNED INTERVENTIONS: 97164- PT Re-evaluation, 97110-Therapeutic exercises, 97530- Therapeutic activity, [id number]- Neuromuscular re-education, 97535- Self Care, 91478- Manual therapy, [id number]- Gait training, [phone]- Orthotic Fit/training, [phone]- Canalith repositioning, [phone]- Electrical stimulation (manual), Patient/Family education, Balance training, Stair  training, Taping, Dry Needling, Joint mobilization, Joint manipulation, Spinal manipulation, Spinal mobilization, Vestibular training, DME instructions, Cryotherapy, and Moist heat  PLAN FOR NEXT SESSION: Theract/therex/balance training as well as postural strengthening/trunk and LE ROM to assist with flexibility. Add to HEP as appropriate.   Edwina Gram, PT 10/11/2023, 9:16 AM

## 2023-10-13 ENCOUNTER — Ambulatory Visit

## 2023-10-13 DIAGNOSIS — M6281 Muscle weakness (generalized): Secondary | ICD-10-CM

## 2023-10-13 DIAGNOSIS — R278 Other lack of coordination: Secondary | ICD-10-CM

## 2023-10-13 DIAGNOSIS — R471 Dysarthria and anarthria: Secondary | ICD-10-CM

## 2023-10-13 DIAGNOSIS — G20A1 Parkinson's disease without dyskinesia, without mention of fluctuations: Secondary | ICD-10-CM

## 2023-10-13 DIAGNOSIS — R262 Difficulty in walking, not elsewhere classified: Secondary | ICD-10-CM

## 2023-10-13 NOTE — Therapy (Signed)
 OUTPATIENT SPEECH LANGUAGE PATHOLOGY PARKINSON'S TREATMENT   Patient Name: Derrick Bishop MRN: [id number] DOB:1946-02-27, 78 y.o., male Today's Date: 10/13/2023  PCP: Firman Hughes, MD REFERRING PROVIDER: Devora Folks, MD   End of Session - 10/13/23 1146     Visit Number 4    Number of Visits 24    Date for SLP Re-Evaluation 12/26/23    SLP Start Time 1100    SLP Stop Time  1145    SLP Time Calculation (min) 45 min    Activity Tolerance Patient tolerated treatment well             Past Medical History:  Diagnosis Date   Hypertension    No past surgical history on file. There are no active problems to display for this patient.   ONSET DATE:  ~3 years ago;   REFERRING DIAG: Parkinson's Disease without dyskinesia, without mention of fluctuations  THERAPY DIAG:  Dysarthria and anarthria  Rationale for Evaluation and Treatment Rehabilitation  SUBJECTIVE:   SUBJECTIVE STATEMENT: Pt alert, pleasant, and cooperative. Pt accompanied by: self  PERTINENT HISTORY: Per Neuro note, 09/15/23, "Parkinson's Disease in a patient with bilateral hand tremor (left > right) + slowness of movement + some changes in balance + hypophonia + micro graphia + hypomimia + minimal bradykinesia on the left + Mild cog wheeling on the right and moderate cog wheeling on the left + decreased arm swing on the left + slight hunched forward posture - symptomatic, without new or worsening features." Pt with hx HTN, HLD, Vit B12 and Vit deficiencies. MRI brain, 12/28/21, "no a cute infarction, hemorrhage, or mass. Mild chronic microvascular ischemic changes."    PAIN:  Are you having pain? No   FALLS: Has patient fallen in last 6 months? Defer to PT  LIVING ENVIRONMENT: Lives with: lives with their spouse Lives in: House/apartment  PLOF:  ambulates with single point cane; driving; indep  PATIENT GOALS  for speech to improve  OBJECTIVE:  TODAY'S TREATMENT:  Reviewed EMST including purpose, how  to complete ex's, and maintenance. Initial training threshold of 24cmH2O obtained. Pt completed 5 sets of 5 reps with min verbal/visual cueing initially, decreasing to rare verbal cues.   Voice ex's completed as follows: Sustained /a/: x10, 85dB Ascending pitch glides: x10, 83dB Descending pitch glides: x10, 82db  Pt read x8 functional phrases twice through with loud and intentional voice, averaging 82 dB.   During short conversational exchanges (less than 1 min), pt average 73-74 dB with evidence of volume decay. For sentence level responses, pt averaged 77db for x5 sentences.  Pt benefit from use of biofeeback using Voice Analyst app.   Pt educated re: changes to speech/voice due to Parkinson's Disease, voice ex's, EMST, SLP POC, and HEP.  PATIENT EDUCATION: Education details: as above Person educated: Patient  Education method: Programmer, multimedia; Handout Education comprehension: verbalized understanding and needs further education   HOME EXERCISE PROGRAM: Voice Ex's and Functional Reading as outlined above  GOALS: Goals reviewed with patient? Yes  SHORT TERM GOALS: Target date: 10 sessions  Pt will participate in further assessment of functional cognitive-linguistic ability with additional goals to be set as appropriate. Baseline: Goal status: INITIAL  2.  The patient will complete HEP (Maximum duration "ah", High/Lows, and Functional Phrases) at average loudness >/= 80 dB and with loud, good quality voice with min cues.  Baseline: Goal status: INITIAL  3.  Pt will sustain conversational loudness (75-85 dB) for 3-5 minutes of conversation with min cues. Baseline:  Goal status: INITIAL    LONG TERM GOALS: Target date: 12 weeks  The patient will complete HEP (Maximum duration "ah", High/Lows, and Functional Phrases) at average loudness >/= 80 dB and with loud, good quality voice indep. Baseline: Goal status: INITIAL  Pt will sustain conversational loudness (75-85 dB) for  8-10 minutes of conversation with min cues. Baseline:  Goal status: INITIAL   ASSESSMENT:  CLINICAL IMPRESSION: Patient is a 78 y.o. male who was seen today for evaluation in setting of Parkinson's Disease. er Neuro note, 09/15/23, "Parkinson's Disease in a patient with bilateral hand tremor (left > right) + slowness of movement + some changes in balance + hypophonia + micro graphia + hypomimia + minimal bradykinesia on the left + Mild cog wheeling on the right and moderate cog wheeling on the left + decreased arm swing on the left + slight hunched forward posture - symptomatic, without new or worsening features." Pt taking carbidopa-levodopa for Parkinson's Disease with recent increased in dosage.  Pt with hx HTN, HLD, Vit B12 and Vit deficiencies. MRI brain, 12/28/21, "no a cute infarction, hemorrhage, or mass. Mild chronic microvascular ischemic changes." Pt presents with a mild hypokinetic dysarthric c/b hypophonia, mildly hoarse vocal quality and intermittent fast rushes of speech. Evidence of volume decay noted. Wife endorsed that speech is worse in home with pt often "muttering." Wife also identified x2 persons who have stopped communicating with pt due to difficulty hearing him. Pt stimulable to verbal cues and would benefit from course of ST to improve pt's ability to communicate at home and in the community.  OBJECTIVE IMPAIRMENTS include dysarthria. These impairments are limiting patient from effectively communicating at home and in community. Factors affecting potential to achieve goals and functional outcome are n/a. Patient will benefit from skilled SLP services to address above impairments and improve overall function.  REHAB POTENTIAL: Good  PLAN: SLP FREQUENCY: 1-2x/week  SLP DURATION: 12 weeks  PLANNED INTERVENTIONS: Functional tasks, Multimodal communication approach, SLP instruction and feedback, Compensatory strategies, Patient/family education, and further cognitive-linguistic  assessment.   Dia Forget, M.S., CCC-SLP Speech-Language Pathologist [facility] [phone] [place])   [facility] at [facility] [address] [place], [place], [address] Phone: [phone]   Fax:  [phone]

## 2023-10-13 NOTE — Therapy (Signed)
 OUTPATIENT PHYSICAL THERAPY NEURO TREATMENT   Patient Name: Derrick Bishop MRN: [id number] DOB:1945/12/02, 78 y.o., male Today's Date: 10/13/2023   PCP: Dr. Bethann Punches REFERRING PROVIDER: Dr. Cristopher Peru  END OF SESSION:  PT End of Session - 10/13/23 1159     Visit Number 4    Number of Visits 24    Date for PT Re-Evaluation 12/26/23    Progress Note Due on Visit 10    PT Start Time 1019    PT Stop Time 1059    PT Time Calculation (min) 40 min    Equipment Utilized During Treatment Gait belt    Activity Tolerance Patient tolerated treatment well    Behavior During Therapy WFL for tasks assessed/performed              Past Medical History:  Diagnosis Date   Hypertension    History reviewed. No pertinent surgical history. There are no active problems to display for this patient.   ONSET DATE: 3 years ago  REFERRING DIAG: Parkinson's disease without dyskinesia, without mention of fluctuations   THERAPY DIAG:  Other lack of coordination  Difficulty in walking, not elsewhere classified  Muscle weakness (generalized)  Parkinson's disease without dyskinesia or fluctuating manifestations (HCC)  Rationale for Evaluation and Treatment: Rehabilitation  SUBJECTIVE:                                                                                                                                                                                             SUBJECTIVE STATEMENT: Pt reports no updates, no pain, no falls. No changes in medication.    Pt accompanied by: significant other- Joyce  PERTINENT HISTORY: History obtained by Dr. Sherryll Burger on 09/15/2023 Assessment & Plan Parkinson's Disease in a patient with bilateral hand tremor (left > right) + slowness of movement + some changes in balance + hypophonia + micro graphia + hypomimia + minimal bradykinesia on the left + Mild cog wheeling on the right and moderate cog wheeling on the left + decreased arm swing on the left +  slight hunched forward posture - symptomatic, without new or worsening features   Parkinson's disease with worsening symptoms, including increased hand tremors, balance problems, and freezing episodes. Current treatment with carbidopa-levodopa three times daily shows no noticeable symptom improvement. Increasing the dose aims to reduce tremors, bradykinesia, and rigidity. The medication is safe with no long-term damage, and higher doses may be necessary as the disease progresses. Early and adequate dosing can preserve functional years, and the highest tolerable dose should be administered early to maximize functional outcomes. - Increase carbidopa-levodopa to 25-100 mg two times  a day  Increase carbidopa-levodopa to 1.5 pills three times daily for two weeks, then increase to 2 pills three times daily. - Order Parkinson's-specific Physical Therapy and Speech Therapy   PAIN:  Are you having pain? No  PRECAUTIONS: Fall  RED FLAGS: None   WEIGHT BEARING RESTRICTIONS: No  FALLS: Has patient fallen in last 6 months? No  LIVING ENVIRONMENT: Lives with: lives with their spouse Lives in: House/apartment Stairs: Yes: External: 3 steps; In process of having railings installed in garage Has following equipment at home: Single point cane  PLOF: Independent  PATIENT GOALS: improve my walking and confidence in getting around.   OBJECTIVE:  Note: Objective measures were completed at Evaluation unless otherwise noted.  DIAGNOSTIC FINDINGS:  Narrative & Impression  CLINICAL DATA:  Neuro deficit, acute, stroke suspected   EXAM: MRI HEAD WITHOUT CONTRAST   TECHNIQUE: Multiplanar, multiecho pulse sequences of the brain and surrounding structures were obtained without intravenous contrast.   COMPARISON:  2012   FINDINGS: Brain: There is no acute infarction or intracranial hemorrhage. There is no intracranial mass, mass effect, or edema. There is no hydrocephalus or extra-axial fluid  collection. Ventricles and sulci are within normal limits in size and configuration. Patchy T2 hyperintensity in the supratentorial white matter is nonspecific but may reflect mild chronic microvascular ischemic changes.   Vascular: Major vessel flow voids at the skull base are preserved.   Skull and upper cervical spine: Normal marrow signal is preserved.   Sinuses/Orbits: Paranasal sinus mucosal thickening. Orbits are unremarkable.   Other: Sella is unremarkable.  Mastoid air cells are clear.   IMPRESSION: No acute infarction, hemorrhage, or mass.   Mild chronic microvascular ischemic changes.     Electronically Signed   By: Guadlupe Spanish M.D.   On: 12/28/2021 13:07      COGNITION: Overall cognitive status: Within functional limits for tasks assessed   SENSATION: WFL  COORDINATION: Slow to initiate at times  EDEMA:  None observed    POSTURE: rounded shoulders and forward head  LOWER EXTREMITY ROM:     Active  Right Eval Left Eval  Hip flexion    Hip extension    Hip abduction    Hip adduction    Hip internal rotation    Hip external rotation    Knee flexion    Knee extension    Ankle dorsiflexion    Ankle plantarflexion    Ankle inversion    Ankle eversion     (Blank rows = not tested)  LOWER EXTREMITY MMT:    MMT Right Eval Left Eval  Hip flexion 4 4  Hip extension 4 4  Hip abduction 4 4  Hip adduction    Hip internal rotation 4 4  Hip external rotation 4 4  Knee flexion 4 4  Knee extension 4 4  Ankle dorsiflexion 4 4  Ankle plantarflexion    Ankle inversion    Ankle eversion    (Blank rows = not tested)  BED MOBILITY:  NOT TESTED- Patient reports independent  TRANSFERS: Assistive device utilized: None  Sit to stand: Complete Independence Stand to sit: Complete Independence Chair to chair: Complete Independence Floor:  Not tested  GAIT: Gait pattern: decreased arm swing- Right, decreased arm swing- Left, decreased step  length- Right, decreased step length- Left, decreased stance time- Right, decreased stance time- Left, decreased stride length, shuffling, festinating, poor foot clearance- Right, and poor foot clearance- Left Distance walked: > 100 feet Assistive device utilized:  has  cane today but did not use- states only using in unfamiliar community settings Level of assistance: CGA   FUNCTIONAL TESTS:  5 times sit to stand: 18.81 sec without UE support Timed up and go (TUG): 16.03 sec avg without AD 6 minute walk test: 937 ftt 10 meter walk test: 0.79 m/s avg without AD Berg Balance Scale: 43/56  PATIENT SURVEYS:  ABC scale 75.6%                                                                                                                              TREATMENT DATE: 10/13/23       NMR: Seated finger flicks with forward reach 20x bilat UE  Seated LTL reach with UE, big open hand, with cross reach and palm up 1x10, 1x5 each side. PT providing hands-on assist for shaping, increasing stretch  Large amplitude STS with bilat shoulder abd 5x, 10x with cuing/modeling   Large lateral step with big reach 10x 5x each way   Gait training: several minutes of the following Gait training completed with and without PVCs for increased arm-swing bilaterally - multiple laps, first with only big arm-swing, then focus only on big steps, then only upright posture. Pt combined these all together with remaining laps. Rates medium.        PATIENT EDUCATION: Education details: large amplitude technique, HEP,  Person educated: Patient Education method: Medical illustrator, VC printout Education comprehension: verbalized understanding, returned demonstration, and verbal cues required  HOME EXERCISE PROGRAM: UPDATED: Access Code: [email or web address] URL: [email or web address] Date: 10/13/2023 Prepared by: Temple Pacini  Exercises - Sit to Stand Without Arm Support  - 1 x daily - 5-6 x  weekly - 2-3 sets - 10 reps - Seated Finger Flicks with Elbow Extension  - 1 x daily - 7 x weekly - 2 sets - 20 reps - Walking  - 1 x daily - 7 x weekly - 1 sets - 1 reps - 6-10 minutes hold  GOALS: Goals reviewed with patient? Yes  SHORT TERM GOALS: Target date: 11/14/2023  Pt will be independent with HEP in order to improve strength and balance in order to decrease fall risk and improve function at home and work. Baseline: Goal status: INITIAL   LONG TERM GOALS: Target date: 12/26/2023  1.  Patient (> 9 years old) will complete five times sit to stand test in < 15 seconds indicating an increased LE strength and improved balance. Baseline: EVAL= 18.81 sec without UE Goal status: INITIAL  2.  Patient will improve ABC score to >80%   to demonstrate statistically significant improvement in mobility and quality of life as it relates to their confidence in his balance.  Baseline: EVAL=75.6 % Goal status: INITIAL   4.  Patient will increase Berg Balance score by > 6 points to demonstrate decreased fall risk during functional activities. Baseline: EVAL= 43/56 Goal status: INITIAL   5.   Patient will reduce timed up and go  to <11 seconds to reduce fall risk and demonstrate improved transfer/gait ability. Baseline: EVAL= 16.03 without AD Goal status: INITIAL  6.   Patient will increase 10 meter walk test to >1.50m/s as to improve gait speed for better community ambulation and to reduce fall risk. Baseline: EVAL= 0.79 m/s avg without AD Goal status: INITIAL  7.   Patient will increase six minute walk test distance to >1000 for progression to community ambulator and improve gait ability Baseline: EVAL: To be tested next visit; 10/05/23: 937 ft  Goal status: INITIAL    ASSESSMENT:  CLINICAL IMPRESSION: Pt with excellent motivation throughout session. He continues to responds quickly to cues to override bradykinetic and hypokinetic movement. Pt had most difficulty with L arm-swing,  requiring PVC cuing. Pt will continue to benefit from skilled physical therapy intervention to address impairments, improve QOL, and attain therapy goals.    OBJECTIVE IMPAIRMENTS: Abnormal gait, decreased balance, decreased coordination, decreased endurance, decreased mobility, difficulty walking, decreased ROM, decreased strength, hypomobility, impaired flexibility, and postural dysfunction.   ACTIVITY LIMITATIONS: carrying, lifting, bending, standing, squatting, stairs, and transfers  PARTICIPATION LIMITATIONS: cleaning, shopping, community activity, and yard work  PERSONAL FACTORS: 1-2 comorbidities: HTN  are also affecting patient's functional outcome.   REHAB POTENTIAL: Good  CLINICAL DECISION MAKING: Stable/uncomplicated  EVALUATION COMPLEXITY: Low  PLAN:  PT FREQUENCY: 1-2x/week  PT DURATION: 12 weeks  PLANNED INTERVENTIONS: 97164- PT Re-evaluation, 97110-Therapeutic exercises, 97530- Therapeutic activity, [id number]- Neuromuscular re-education, 97535- Self Care, 16109- Manual therapy, [id number]- Gait training, [phone]- Orthotic Fit/training, [id number]- Canalith repositioning, [phone]- Electrical stimulation (manual), Patient/Family education, Balance training, Stair training, Taping, Dry Needling, Joint mobilization, Joint manipulation, Spinal manipulation, Spinal mobilization, Vestibular training, DME instructions, Cryotherapy, and Moist heat  PLAN FOR NEXT SESSION: Theract/therex/balance training as well as postural strengthening/trunk and LE ROM to assist with flexibility. Add to HEP as appropriate.   Samie Crews, PT 10/13/2023, 12:02 PM

## 2023-10-17 NOTE — Therapy (Incomplete)
 OUTPATIENT PHYSICAL THERAPY NEURO TREATMENT   Patient Name: Derrick Bishop MRN: [id number] DOB:10-31-1945, 78 y.o., male Today's Date: 10/17/2023   PCP: Dr. Firman Hughes REFERRING PROVIDER: Dr. Devora Folks  END OF SESSION:     Past Medical History:  Diagnosis Date   Hypertension    No past surgical history on file. There are no active problems to display for this patient.   ONSET DATE: 3 years ago  REFERRING DIAG: Parkinson's disease without dyskinesia, without mention of fluctuations   THERAPY DIAG:  No diagnosis found.  Rationale for Evaluation and Treatment: Rehabilitation  SUBJECTIVE:                                                                                                                                                                                             SUBJECTIVE STATEMENT: ***   Pt accompanied by: significant other- Joyce  PERTINENT HISTORY: History obtained by Dr. Mason Sole on 09/15/2023 Assessment & Plan Parkinson's Disease in a patient with bilateral hand tremor (left > right) + slowness of movement + some changes in balance + hypophonia + micro graphia + hypomimia + minimal bradykinesia on the left + Mild cog wheeling on the right and moderate cog wheeling on the left + decreased arm swing on the left + slight hunched forward posture - symptomatic, without new or worsening features   Parkinson's disease with worsening symptoms, including increased hand tremors, balance problems, and freezing episodes. Current treatment with carbidopa-levodopa three times daily shows no noticeable symptom improvement. Increasing the dose aims to reduce tremors, bradykinesia, and rigidity. The medication is safe with no long-term damage, and higher doses may be necessary as the disease progresses. Early and adequate dosing can preserve functional years, and the highest tolerable dose should be administered early to maximize functional outcomes. - Increase  carbidopa-levodopa to 25-100 mg two times a day  Increase carbidopa-levodopa to 1.5 pills three times daily for two weeks, then increase to 2 pills three times daily. - Order Parkinson's-specific Physical Therapy and Speech Therapy   PAIN:  Are you having pain? No  PRECAUTIONS: Fall  RED FLAGS: None   WEIGHT BEARING RESTRICTIONS: No  FALLS: Has patient fallen in last 6 months? No  LIVING ENVIRONMENT: Lives with: lives with their spouse Lives in: House/apartment Stairs: Yes: External: 3 steps; In process of having railings installed in garage Has following equipment at home: Single point cane  PLOF: Independent  PATIENT GOALS: improve my walking and confidence in getting around.   OBJECTIVE:  Note: Objective measures were completed at Evaluation unless otherwise noted.  DIAGNOSTIC FINDINGS:  Narrative & Impression  CLINICAL  DATA:  Neuro deficit, acute, stroke suspected   EXAM: MRI HEAD WITHOUT CONTRAST   TECHNIQUE: Multiplanar, multiecho pulse sequences of the brain and surrounding structures were obtained without intravenous contrast.   COMPARISON:  2012   FINDINGS: Brain: There is no acute infarction or intracranial hemorrhage. There is no intracranial mass, mass effect, or edema. There is no hydrocephalus or extra-axial fluid collection. Ventricles and sulci are within normal limits in size and configuration. Patchy T2 hyperintensity in the supratentorial white matter is nonspecific but may reflect mild chronic microvascular ischemic changes.   Vascular: Major vessel flow voids at the skull base are preserved.   Skull and upper cervical spine: Normal marrow signal is preserved.   Sinuses/Orbits: Paranasal sinus mucosal thickening. Orbits are unremarkable.   Other: Sella is unremarkable.  Mastoid air cells are clear.   IMPRESSION: No acute infarction, hemorrhage, or mass.   Mild chronic microvascular ischemic changes.     Electronically Signed   By:  Geannie Keener M.D.   On: 12/28/2021 13:07      COGNITION: Overall cognitive status: Within functional limits for tasks assessed   SENSATION: WFL  COORDINATION: Slow to initiate at times  EDEMA:  None observed    POSTURE: rounded shoulders and forward head  LOWER EXTREMITY ROM:     Active  Right Eval Left Eval  Hip flexion    Hip extension    Hip abduction    Hip adduction    Hip internal rotation    Hip external rotation    Knee flexion    Knee extension    Ankle dorsiflexion    Ankle plantarflexion    Ankle inversion    Ankle eversion     (Blank rows = not tested)  LOWER EXTREMITY MMT:    MMT Right Eval Left Eval  Hip flexion 4 4  Hip extension 4 4  Hip abduction 4 4  Hip adduction    Hip internal rotation 4 4  Hip external rotation 4 4  Knee flexion 4 4  Knee extension 4 4  Ankle dorsiflexion 4 4  Ankle plantarflexion    Ankle inversion    Ankle eversion    (Blank rows = not tested)  BED MOBILITY:  NOT TESTED- Patient reports independent  TRANSFERS: Assistive device utilized: None  Sit to stand: Complete Independence Stand to sit: Complete Independence Chair to chair: Complete Independence Floor:  Not tested  GAIT: Gait pattern: decreased arm swing- Right, decreased arm swing- Left, decreased step length- Right, decreased step length- Left, decreased stance time- Right, decreased stance time- Left, decreased stride length, shuffling, festinating, poor foot clearance- Right, and poor foot clearance- Left Distance walked: > 100 feet Assistive device utilized:  has cane today but did not use- states only using in unfamiliar community settings Level of assistance: CGA   FUNCTIONAL TESTS:  5 times sit to stand: 18.81 sec without UE support Timed up and go (TUG): 16.03 sec avg without AD 6 minute walk test: 937 ftt 10 meter walk test: 0.79 m/s avg without AD Berg Balance Scale: 43/56  PATIENT SURVEYS:  ABC scale 75.6%  TREATMENT DATE: 10/17/23       NMR: Seated finger flicks with forward reach 20x bilat UE  Seated LTL reach with UE, big open hand, with cross reach and palm up 1x10, 1x5 each side. PT providing hands-on assist for shaping, increasing stretch  Large amplitude STS with bilat shoulder abd 5x, 10x with cuing/modeling   Large lateral step with big reach 10x 5x each way   Gait training: several minutes of the following Gait training completed with and without PVCs for increased arm-swing bilaterally - multiple laps, first with only big arm-swing, then focus only on big steps, then only upright posture. Pt combined these all together with remaining laps. Rates medium.        PATIENT EDUCATION: Education details: large amplitude technique, HEP,  Person educated: Patient Education method: Medical illustrator, VC printout Education comprehension: verbalized understanding, returned demonstration, and verbal cues required  HOME EXERCISE PROGRAM: UPDATED: Access Code: [email or web address] URL: [email or web address] Date: 10/13/2023 Prepared by: Aminta Kales  Exercises - Sit to Stand Without Arm Support  - 1 x daily - 5-6 x weekly - 2-3 sets - 10 reps - Seated Finger Flicks with Elbow Extension  - 1 x daily - 7 x weekly - 2 sets - 20 reps - Walking  - 1 x daily - 7 x weekly - 1 sets - 1 reps - 6-10 minutes hold  GOALS: Goals reviewed with patient? Yes  SHORT TERM GOALS: Target date: 11/14/2023  Pt will be independent with HEP in order to improve strength and balance in order to decrease fall risk and improve function at home and work. Baseline: Goal status: INITIAL   LONG TERM GOALS: Target date: 12/26/2023  1.  Patient (> 16 years old) will complete five times sit to stand test in < 15 seconds indicating an increased LE strength and improved balance. Baseline:  EVAL= 18.81 sec without UE Goal status: INITIAL  2.  Patient will improve ABC score to >80%   to demonstrate statistically significant improvement in mobility and quality of life as it relates to their confidence in his balance.  Baseline: EVAL=75.6 % Goal status: INITIAL   4.  Patient will increase Berg Balance score by > 6 points to demonstrate decreased fall risk during functional activities. Baseline: EVAL= 43/56 Goal status: INITIAL   5.   Patient will reduce timed up and go to <11 seconds to reduce fall risk and demonstrate improved transfer/gait ability. Baseline: EVAL= 16.03 without AD Goal status: INITIAL  6.   Patient will increase 10 meter walk test to >1.66m/s as to improve gait speed for better community ambulation and to reduce fall risk. Baseline: EVAL= 0.79 m/s avg without AD Goal status: INITIAL  7.   Patient will increase six minute walk test distance to >1000 for progression to community ambulator and improve gait ability Baseline: EVAL: To be tested next visit; 10/05/23: 937 ft  Goal status: INITIAL    ASSESSMENT:  CLINICAL IMPRESSION: *** Pt will continue to benefit from skilled physical therapy intervention to address impairments, improve QOL, and attain therapy goals.    OBJECTIVE IMPAIRMENTS: Abnormal gait, decreased balance, decreased coordination, decreased endurance, decreased mobility, difficulty walking, decreased ROM, decreased strength, hypomobility, impaired flexibility, and postural dysfunction.   ACTIVITY LIMITATIONS: carrying, lifting, bending, standing, squatting, stairs, and transfers  PARTICIPATION LIMITATIONS: cleaning, shopping, community activity, and yard work  PERSONAL FACTORS: 1-2 comorbidities: HTN  are also affecting patient's functional outcome.   REHAB POTENTIAL: Good  CLINICAL DECISION MAKING: Stable/uncomplicated  EVALUATION COMPLEXITY: Low  PLAN:  PT FREQUENCY: 1-2x/week  PT DURATION: 12 weeks  PLANNED INTERVENTIONS:  97164- PT Re-evaluation, 97110-Therapeutic exercises, 97530- Therapeutic activity, [id number]- Neuromuscular re-education, 97535- Self Care, 16109- Manual therapy, [phone]- Gait training, [phone]- Orthotic Fit/training, [phone]- Canalith repositioning, [phone]- Electrical stimulation (manual), Patient/Family education, Balance training, Stair training, Taping, Dry Needling, Joint mobilization, Joint manipulation, Spinal manipulation, Spinal mobilization, Vestibular training, DME instructions, Cryotherapy, and Moist heat  PLAN FOR NEXT SESSION: Theract/therex/balance training as well as postural strengthening/trunk and LE ROM to assist with flexibility. Add to HEP as appropriate.   Myking Sar, PT 10/17/2023, 5:31 PM

## 2023-10-18 ENCOUNTER — Ambulatory Visit

## 2023-10-18 ENCOUNTER — Ambulatory Visit: Admitting: Physical Therapy

## 2023-10-18 DIAGNOSIS — G20A1 Parkinson's disease without dyskinesia, without mention of fluctuations: Secondary | ICD-10-CM

## 2023-10-18 DIAGNOSIS — R471 Dysarthria and anarthria: Secondary | ICD-10-CM | POA: Diagnosis not present

## 2023-10-18 DIAGNOSIS — R269 Unspecified abnormalities of gait and mobility: Secondary | ICD-10-CM

## 2023-10-18 DIAGNOSIS — M6281 Muscle weakness (generalized): Secondary | ICD-10-CM

## 2023-10-18 DIAGNOSIS — R262 Difficulty in walking, not elsewhere classified: Secondary | ICD-10-CM

## 2023-10-18 DIAGNOSIS — R278 Other lack of coordination: Secondary | ICD-10-CM

## 2023-10-18 DIAGNOSIS — R2681 Unsteadiness on feet: Secondary | ICD-10-CM

## 2023-10-18 NOTE — Therapy (Signed)
 OUTPATIENT PHYSICAL THERAPY NEURO TREATMENT   Patient Name: Derrick Bishop MRN: [id number] DOB:[birth year], 78 y.o., male Today's Date: 10/18/2023   PCP: Dr. Firman Hughes REFERRING PROVIDER: Dr. Devora Folks  END OF SESSION:  PT End of Session - 10/18/23 1528     Visit Number 5    Number of Visits 24    Date for PT Re-Evaluation 12/26/23    Progress Note Due on Visit 10    PT Start Time 1529    PT Stop Time 1615    PT Time Calculation (min) 46 min    Equipment Utilized During Treatment Gait belt    Activity Tolerance Patient tolerated treatment well    Behavior During Therapy WFL for tasks assessed/performed               Past Medical History:  Diagnosis Date   Hypertension    No past surgical history on file. There are no active problems to display for this patient.   ONSET DATE: 3 years ago  REFERRING DIAG: Parkinson's disease without dyskinesia, without mention of fluctuations   THERAPY DIAG:  Other lack of coordination  Difficulty in walking, not elsewhere classified  Muscle weakness (generalized)  Parkinson's disease without dyskinesia or fluctuating manifestations (HCC)  Abnormality of gait and mobility  Unsteadiness on feet  Rationale for Evaluation and Treatment: Rehabilitation  SUBJECTIVE:                                                                                                                                                                                             SUBJECTIVE STATEMENT:  Pt reports he is doing "OK," reports feeling like he over did it this weekend by pushing small lawn mowers during yard work, causing him to be more tired than normal today. Pt reports having felt safe with this activity and doing it with his wife's assistance. Denies stumbles/falls. Denies pain.   Pt accompanied by: self  PERTINENT HISTORY:  History obtained by Dr. Mason Sole on 09/15/2023, the below from that note: Assessment & Plan Parkinson's Disease  in a patient with bilateral hand tremor (left > right) + slowness of movement + some changes in balance + hypophonia + micro graphia + hypomimia + minimal bradykinesia on the left + Mild cog wheeling on the right and moderate cog wheeling on the left + decreased arm swing on the left + slight hunched forward posture - symptomatic, without new or worsening features   Parkinson's disease with worsening symptoms, including increased hand tremors, balance problems, and freezing episodes. Current treatment with carbidopa-levodopa three times daily shows no noticeable symptom improvement. Increasing the dose  aims to reduce tremors, bradykinesia, and rigidity. The medication is safe with no long-term damage, and higher doses may be necessary as the disease progresses. Early and adequate dosing can preserve functional years, and the highest tolerable dose should be administered early to maximize functional outcomes. - Increase carbidopa-levodopa to 25-100 mg two times a day  Increase carbidopa-levodopa to 1.5 pills three times daily for two weeks, then increase to 2 pills three times daily. - Order Parkinson's-specific Physical Therapy and Speech Therapy   PAIN:  Are you having pain? No  PRECAUTIONS: Fall  RED FLAGS: None   WEIGHT BEARING RESTRICTIONS: No  FALLS: Has patient fallen in last 6 months? No  LIVING ENVIRONMENT: Lives with: lives with their spouse Lives in: House/apartment Stairs: Yes: External: 3 steps; In process of having railings installed in garage Has following equipment at home: Single point cane  PLOF: Independent  PATIENT GOALS: improve my walking and confidence in getting around.   OBJECTIVE:  Note: Objective measures were completed at Evaluation unless otherwise noted.  DIAGNOSTIC FINDINGS:  Narrative & Impression  CLINICAL DATA:  Neuro deficit, acute, stroke suspected   EXAM: MRI HEAD WITHOUT CONTRAST   TECHNIQUE: Multiplanar, multiecho pulse sequences of the  brain and surrounding structures were obtained without intravenous contrast.   COMPARISON:  2012   FINDINGS: Brain: There is no acute infarction or intracranial hemorrhage. There is no intracranial mass, mass effect, or edema. There is no hydrocephalus or extra-axial fluid collection. Ventricles and sulci are within normal limits in size and configuration. Patchy T2 hyperintensity in the supratentorial white matter is nonspecific but may reflect mild chronic microvascular ischemic changes.   Vascular: Major vessel flow voids at the skull base are preserved.   Skull and upper cervical spine: Normal marrow signal is preserved.   Sinuses/Orbits: Paranasal sinus mucosal thickening. Orbits are unremarkable.   Other: Sella is unremarkable.  Mastoid air cells are clear.   IMPRESSION: No acute infarction, hemorrhage, or mass.   Mild chronic microvascular ischemic changes.     Electronically Signed   By: Geannie Keener M.D.   On: 12/28/2021 13:07      COGNITION: Overall cognitive status: Within functional limits for tasks assessed   SENSATION: WFL  COORDINATION: Slow to initiate at times  EDEMA:  None observed    POSTURE: rounded shoulders and forward head  LOWER EXTREMITY ROM:     Active  Right Eval Left Eval  Hip flexion    Hip extension    Hip abduction    Hip adduction    Hip internal rotation    Hip external rotation    Knee flexion    Knee extension    Ankle dorsiflexion    Ankle plantarflexion    Ankle inversion    Ankle eversion     (Blank rows = not tested)  LOWER EXTREMITY MMT:    MMT Right Eval Left Eval  Hip flexion 4 4  Hip extension 4 4  Hip abduction 4 4  Hip adduction    Hip internal rotation 4 4  Hip external rotation 4 4  Knee flexion 4 4  Knee extension 4 4  Ankle dorsiflexion 4 4  Ankle plantarflexion    Ankle inversion    Ankle eversion    (Blank rows = not tested)  BED MOBILITY:  NOT TESTED- Patient reports  independent  TRANSFERS: Assistive device utilized: None  Sit to stand: Complete Independence Stand to sit: Complete Independence Chair to chair: Complete Independence Floor:  Not tested  GAIT: Gait pattern: decreased arm swing- Right, decreased arm swing- Left, decreased step length- Right, decreased step length- Left, decreased stance time- Right, decreased stance time- Left, decreased stride length, shuffling, festinating, poor foot clearance- Right, and poor foot clearance- Left Distance walked: > 100 feet Assistive device utilized:  has cane today but did not use- states only using in unfamiliar community settings Level of assistance: CGA   FUNCTIONAL TESTS:  5 times sit to stand: 18.81 sec without UE support Timed up and go (TUG): 16.03 sec avg without AD 6 minute walk test: 937 ftt 10 meter walk test: 0.79 m/s avg without AD Berg Balance Scale: 43/56  PATIENT SURVEYS:  ABC scale 75.6%                                                                                                                              TREATMENT DATE: 10/18/23    Dynamic gait training ~339ft, no AD, with CGA/light min A for steadying - cuing for increased gait speed, longer step lengths, increased arm swing (L more impaired than R), and improved upright trunk posture. *Greatest challenge was turning 180degrees at end of the hallway to change directions with pt having onset of significant freezing   Dynamic gait training using 2 cones for figure-8 walking with dual-task of having pt count outloud to help with decreased freezing - continues to have occasional freezing - progressed to having therapist suddenly call out "stop" during and then pt re-start gait without freezing. Light min A for balance throughout   Dynamic gait training with 4 cones in a row - having pt walk forwards to 2nd cone then walk backwards to 1st cone followed by suddenly turning to walk around the cone prior to returning to forward  walking to 3rd cone and repeating - goal of further targeting freezing with turning but having pt turn at end of backwards gait  - this was very challenging for patient requiring min A for balance due to pt being very fearful of backwards walking  - has short, shuffled backwards steps although no severe posterior lean noted as would be anticipated, but worsens if pt also has freezing episode during backwards gait - continues to be challenged by turning to navigate around the cone with freezing at least 75% of the time  Pt reports walking backwards is very challenging.  Added seated trunk rotations with large UE movements to patient's HEP - reviewed with pt, performing 10 reps and provided updated printout. Therapist providing mirroring of movements to ensure pt with proper technique.  Standing lateral stepping with ipsilateral arm reach x8 reps with therapist providing mirroring of the movement to improve step length and promote larger arm movement - close supervision for safety with no LOB   Forward/backwards step to pink target with contralateral arm reaching across patient's body to an external target  x10 reps - pt with significant difficulty motor planning/sequencing this task requiring max  verbal/tactile cuing to step correct LE with correct arm reach   Continued to notice patient with difficulty turning to sit in chair and pt reports visual cuing really helps him with breaking his "freezing" so performed block practice stand pivot transfers with 2 chairs a few feet apart where pt had to take a couple of steps and turn x5 reps - educated pt to step the foot closest to the chair, towards the chair leg rest furthest from him (to help him pivot and turn to step back to the seat)  Reviewed just stated visual cue to improve turning to sit in a chair, but in a real-world functional set-up by having pt walk towards exit and turn to sit in chair just past the doorway - pt demos good carryover of  using the visual cue without freezing!- CGA for safety.    PATIENT EDUCATION: Education details: large amplitude technique, HEP,  Person educated: Patient Education method: Medical illustrator, VC printout Education comprehension: verbalized understanding, returned demonstration, and verbal cues required  HOME EXERCISE PROGRAM:  Access Code: [email or web address] URL: [email or web address] Date: 10/18/2023 Prepared by: Carlen Chasten  Exercises - Walking  - 1 x daily - 7 x weekly - 1 sets - 1 reps - 6-10 minutes hold - Sit to Stand Without Arm Support  - 1 x daily - 5-6 x weekly - 2-3 sets - 10 reps - Seated Finger Flicks with Elbow Extension  - 1 x daily - 7 x weekly - 2 sets - 20 reps - Seated Reaching to Side and Across Body  - 1 x daily - 7 x weekly - 2 sets - 10 reps  GOALS: Goals reviewed with patient? Yes  SHORT TERM GOALS: Target date: 11/14/2023  Pt will be independent with HEP in order to improve strength and balance in order to decrease fall risk and improve function at home and work. Baseline: Goal status: INITIAL   LONG TERM GOALS: Target date: 12/26/2023  1.  Patient (> 58 years old) will complete five times sit to stand test in < 15 seconds indicating an increased LE strength and improved balance. Baseline: EVAL= 18.81 sec without UE Goal status: INITIAL  2.  Patient will improve ABC score to >80%   to demonstrate statistically significant improvement in mobility and quality of life as it relates to their confidence in his balance.  Baseline: EVAL=75.6 % Goal status: INITIAL   4.  Patient will increase Berg Balance score by > 6 points to demonstrate decreased fall risk during functional activities. Baseline: EVAL= 43/56 Goal status: INITIAL   5.   Patient will reduce timed up and go to <11 seconds to reduce fall risk and demonstrate improved transfer/gait ability. Baseline: EVAL= 16.03 without AD Goal status: INITIAL  6.   Patient will  increase 10 meter walk test to >1.65m/s as to improve gait speed for better community ambulation and to reduce fall risk. Baseline: EVAL= 0.79 m/s avg without AD Goal status: INITIAL  7.   Patient will increase six minute walk test distance to >1000 for progression to community ambulator and improve gait ability Baseline: EVAL: To be tested next visit; 10/05/23: 937 ft  Goal status: INITIAL    ASSESSMENT:  CLINICAL IMPRESSION:  Pt with excellent motivation throughout session. Patient continues to demonstrate primary deficits of bradykinesia and hypokinesia, but also significant freezing any time he has to turn (such as changing directions when walking or when turning to sit in a chair). Therapy session focused on various  interventions to challenge patient turning without freezing. Also, patient demos impaired ability to ambulate backwards and reports significant fear with this; therefore, would benefit from continued focus on this because patients ability to ambulate backwards has been linked to fall risk. Patient responded really well with the visual cue to step towards chair leg rest when turning to sit to decrease freezing and would benefit from reinforcement of this. Mr. Morrone will continue to benefit from skilled physical therapy intervention to address impairments, improve QOL, decrease fall risk, and attain therapy goals.    OBJECTIVE IMPAIRMENTS: Abnormal gait, decreased balance, decreased coordination, decreased endurance, decreased mobility, difficulty walking, decreased ROM, decreased strength, hypomobility, impaired flexibility, and postural dysfunction.   ACTIVITY LIMITATIONS: carrying, lifting, bending, standing, squatting, stairs, and transfers  PARTICIPATION LIMITATIONS: cleaning, shopping, community activity, and yard work  PERSONAL FACTORS: 1-2 comorbidities: HTN  are also affecting patient's functional outcome.   REHAB POTENTIAL: Good  CLINICAL DECISION MAKING:  Stable/uncomplicated  EVALUATION COMPLEXITY: Low  PLAN:  PT FREQUENCY: 1-2x/week  PT DURATION: 12 weeks  PLANNED INTERVENTIONS: 97164- PT Re-evaluation, 97110-Therapeutic exercises, 97530- Therapeutic activity, 97112- Neuromuscular re-education, 97535- Self Care, 78295- Manual therapy, [phone]- Gait training, [phone]- Orthotic Fit/training, [phone]- Canalith repositioning, [phone]- Electrical stimulation (manual), Patient/Family education, Balance training, Stair training, Taping, Dry Needling, Joint mobilization, Joint manipulation, Spinal manipulation, Spinal mobilization, Vestibular training, DME instructions, Cryotherapy, and Moist heat  PLAN FOR NEXT SESSION:  - review visual cue of chair legrest to step towards in order to decrease freezing when turning to sit  - continue dynamic gait training addressing freezing when turning - backwards gait training - dual-task challenges and/or motor sequencing challenges could use Blaze Pods - progress HEP as appropriate - Dynamic stepping balance training  - Postural strengthening/trunk and LE ROM to assist with flexibility    Braeleigh Pyper, PT, DPT, NCS, CSRS Physical Therapist - [facility]  [facility]  5:15 PM 10/18/23

## 2023-10-18 NOTE — Therapy (Signed)
 OUTPATIENT SPEECH LANGUAGE PATHOLOGY PARKINSON'S TREATMENT   Patient Name: Derrick Bishop MRN: [id number] DOB:[birth year], 78 y.o., male Today's Date: 10/18/2023  PCP: Firman Hughes, MD REFERRING PROVIDER: Devora Folks, MD   End of Session - 10/18/23 1533     Visit Number 5    Number of Visits 24    Date for SLP Re-Evaluation 12/26/23    SLP Start Time 1445    SLP Stop Time  1525    SLP Time Calculation (min) 40 min    Activity Tolerance Patient tolerated treatment well             Past Medical History:  Diagnosis Date   Hypertension    No past surgical history on file. There are no active problems to display for this patient.   ONSET DATE:  ~3 years ago;   REFERRING DIAG: Parkinson's Disease without dyskinesia, without mention of fluctuations  THERAPY DIAG:  Dysarthria and anarthria  Rationale for Evaluation and Treatment Rehabilitation  SUBJECTIVE:   SUBJECTIVE STATEMENT: Pt alert, pleasant, and cooperative. Pt accompanied by: self  PERTINENT HISTORY: Per Neuro note, 09/15/23, "Parkinson's Disease in a patient with bilateral hand tremor (left > right) + slowness of movement + some changes in balance + hypophonia + micro graphia + hypomimia + minimal bradykinesia on the left + Mild cog wheeling on the right and moderate cog wheeling on the left + decreased arm swing on the left + slight hunched forward posture - symptomatic, without new or worsening features." Pt with hx HTN, HLD, Vit B12 and Vit deficiencies. MRI brain, 12/28/21, "no a cute infarction, hemorrhage, or mass. Mild chronic microvascular ischemic changes."    PAIN:  Are you having pain? No   FALLS: Has patient fallen in last 6 months? Defer to PT  LIVING ENVIRONMENT: Lives with: lives with their spouse Lives in: House/apartment  PLOF:  ambulates with single point cane; driving; indep  PATIENT GOALS  for speech to improve  OBJECTIVE:  TODAY'S TREATMENT:  Reviewed EMST including purpose, how  to complete ex's, and maintenance. Increased training threshold by 1/4 turn. Pt completed 5 sets of 5 reps with min verbal/visual cueing initially, decreasing to rare verbal cues.   Voice ex's completed as follows: Sustained /a/: x10, 83dB Ascending pitch glides: x10, 80dB Descending pitch glides: x10, 80db  Pt read x8 functional phrases twice through with loud and intentional voice, averaging 82 dB.   During short conversational exchanges (less than 1 min), pt average 73 dB with evidence of volume decay.   Pt endorsed fatigue, in general, which may have been affecting performance this date.   Pt benefited from use of biofeeback using Voice Analyst app.   Pt educated re: changes to speech/voice due to Parkinson's Disease, voice ex's, EMST, SLP POC, and HEP.  PATIENT EDUCATION: Education details: as above Person educated: Patient  Education method: Programmer, multimedia; Handout Education comprehension: verbalized understanding and needs further education   HOME EXERCISE PROGRAM: Voice Ex's and Functional Reading as outlined above  GOALS: Goals reviewed with patient? Yes  SHORT TERM GOALS: Target date: 10 sessions  Pt will participate in further assessment of functional cognitive-linguistic ability with additional goals to be set as appropriate. Baseline: Goal status: INITIAL  2.  The patient will complete HEP (Maximum duration "ah", High/Lows, and Functional Phrases) at average loudness >/= 80 dB and with loud, good quality voice with min cues.  Baseline: Goal status: INITIAL  3.  Pt will sustain conversational loudness (75-85 dB) for 3-5 minutes  of conversation with min cues. Baseline:  Goal status: INITIAL    LONG TERM GOALS: Target date: 12 weeks  The patient will complete HEP (Maximum duration "ah", High/Lows, and Functional Phrases) at average loudness >/= 80 dB and with loud, good quality voice indep. Baseline: Goal status: INITIAL  Pt will sustain conversational  loudness (75-85 dB) for 8-10 minutes of conversation with min cues. Baseline:  Goal status: INITIAL   ASSESSMENT:  CLINICAL IMPRESSION: Patient is a 78 y.o. male who was seen today for evaluation in setting of Parkinson's Disease. er Neuro note, 09/15/23, "Parkinson's Disease in a patient with bilateral hand tremor (left > right) + slowness of movement + some changes in balance + hypophonia + micro graphia + hypomimia + minimal bradykinesia on the left + Mild cog wheeling on the right and moderate cog wheeling on the left + decreased arm swing on the left + slight hunched forward posture - symptomatic, without new or worsening features." Pt taking carbidopa-levodopa for Parkinson's Disease with recent increased in dosage.  Pt with hx HTN, HLD, Vit B12 and Vit deficiencies. MRI brain, 12/28/21, "no a cute infarction, hemorrhage, or mass. Mild chronic microvascular ischemic changes." Pt presents with a mild hypokinetic dysarthric c/b hypophonia, mildly hoarse vocal quality and intermittent fast rushes of speech. Evidence of volume decay noted. Wife endorsed that speech is worse in home with pt often "muttering." Wife also identified x2 persons who have stopped communicating with pt due to difficulty hearing him. Pt stimulable to verbal cues and would benefit from course of ST to improve pt's ability to communicate at home and in the community.  OBJECTIVE IMPAIRMENTS include dysarthria. These impairments are limiting patient from effectively communicating at home and in community. Factors affecting potential to achieve goals and functional outcome are n/a. Patient will benefit from skilled SLP services to address above impairments and improve overall function.  REHAB POTENTIAL: Good  PLAN: SLP FREQUENCY: 1-2x/week  SLP DURATION: 12 weeks  PLANNED INTERVENTIONS: Functional tasks, Multimodal communication approach, SLP instruction and feedback, Compensatory strategies, Patient/family education, and  further cognitive-linguistic assessment.   Dia Forget, M.S., CCC-SLP Speech-Language Pathologist [facility] [phone] [place])    [facility] at [facility] [address] [place], [place], [address] Phone: [phone]   Fax:  [phone]

## 2023-10-19 NOTE — Therapy (Signed)
 OUTPATIENT PHYSICAL THERAPY NEURO TREATMENT   Patient Name: Derrick Bishop MRN: [id number] DOB:1945/09/09, 78 y.o., male Today's Date: 10/20/2023   PCP: Dr. Firman Hughes REFERRING PROVIDER: Dr. Devora Folks  END OF SESSION:  PT End of Session - 10/20/23 1134     Visit Number 6    Number of Visits 24    Date for PT Re-Evaluation 12/26/23    Progress Note Due on Visit 10    PT Start Time 1145    PT Stop Time 1229    PT Time Calculation (min) 44 min    Equipment Utilized During Treatment Gait belt    Activity Tolerance Patient tolerated treatment well    Behavior During Therapy WFL for tasks assessed/performed                Past Medical History:  Diagnosis Date   Hypertension    History reviewed. No pertinent surgical history. There are no active problems to display for this patient.   ONSET DATE: 3 years ago  REFERRING DIAG: Parkinson's disease without dyskinesia, without mention of fluctuations   THERAPY DIAG:  Other lack of coordination  Difficulty in walking, not elsewhere classified  Muscle weakness (generalized)  Parkinson's disease without dyskinesia or fluctuating manifestations (HCC)  Unsteadiness on feet  Rationale for Evaluation and Treatment: Rehabilitation  SUBJECTIVE:                                                                                                                                                                                             SUBJECTIVE STATEMENT:  Patient reports he is having a rougher day today.    Pt accompanied by: self  PERTINENT HISTORY:  History obtained by Dr. Mason Sole on 09/15/2023, the below from that note: Assessment & Plan Parkinson's Disease in a patient with bilateral hand tremor (left > right) + slowness of movement + some changes in balance + hypophonia + micro graphia + hypomimia + minimal bradykinesia on the left + Mild cog wheeling on the right and moderate cog wheeling on the left + decreased  arm swing on the left + slight hunched forward posture - symptomatic, without new or worsening features   Parkinson's disease with worsening symptoms, including increased hand tremors, balance problems, and freezing episodes. Current treatment with carbidopa-levodopa three times daily shows no noticeable symptom improvement. Increasing the dose aims to reduce tremors, bradykinesia, and rigidity. The medication is safe with no long-term damage, and higher doses may be necessary as the disease progresses. Early and adequate dosing can preserve functional years, and the highest tolerable dose should be administered early to maximize functional outcomes. -  Increase carbidopa-levodopa to 25-100 mg two times a day  Increase carbidopa-levodopa to 1.5 pills three times daily for two weeks, then increase to 2 pills three times daily. - Order Parkinson's-specific Physical Therapy and Speech Therapy   PAIN:  Are you having pain? No  PRECAUTIONS: Fall  RED FLAGS: None   WEIGHT BEARING RESTRICTIONS: No  FALLS: Has patient fallen in last 6 months? No  LIVING ENVIRONMENT: Lives with: lives with their spouse Lives in: House/apartment Stairs: Yes: External: 3 steps; In process of having railings installed in garage Has following equipment at home: Single point cane  PLOF: Independent  PATIENT GOALS: improve my walking and confidence in getting around.   OBJECTIVE:  Note: Objective measures were completed at Evaluation unless otherwise noted.  DIAGNOSTIC FINDINGS:  Narrative & Impression  CLINICAL DATA:  Neuro deficit, acute, stroke suspected   EXAM: MRI HEAD WITHOUT CONTRAST   TECHNIQUE: Multiplanar, multiecho pulse sequences of the brain and surrounding structures were obtained without intravenous contrast.   COMPARISON:  2012   FINDINGS: Brain: There is no acute infarction or intracranial hemorrhage. There is no intracranial mass, mass effect, or edema. There is no hydrocephalus or  extra-axial fluid collection. Ventricles and sulci are within normal limits in size and configuration. Patchy T2 hyperintensity in the supratentorial white matter is nonspecific but may reflect mild chronic microvascular ischemic changes.   Vascular: Major vessel flow voids at the skull base are preserved.   Skull and upper cervical spine: Normal marrow signal is preserved.   Sinuses/Orbits: Paranasal sinus mucosal thickening. Orbits are unremarkable.   Other: Sella is unremarkable.  Mastoid air cells are clear.   IMPRESSION: No acute infarction, hemorrhage, or mass.   Mild chronic microvascular ischemic changes.     Electronically Signed   By: Geannie Keener M.D.   On: 12/28/2021 13:07      COGNITION: Overall cognitive status: Within functional limits for tasks assessed   SENSATION: WFL  COORDINATION: Slow to initiate at times  EDEMA:  None observed    POSTURE: rounded shoulders and forward head  LOWER EXTREMITY ROM:     Active  Right Eval Left Eval  Hip flexion    Hip extension    Hip abduction    Hip adduction    Hip internal rotation    Hip external rotation    Knee flexion    Knee extension    Ankle dorsiflexion    Ankle plantarflexion    Ankle inversion    Ankle eversion     (Blank rows = not tested)  LOWER EXTREMITY MMT:    MMT Right Eval Left Eval  Hip flexion 4 4  Hip extension 4 4  Hip abduction 4 4  Hip adduction    Hip internal rotation 4 4  Hip external rotation 4 4  Knee flexion 4 4  Knee extension 4 4  Ankle dorsiflexion 4 4  Ankle plantarflexion    Ankle inversion    Ankle eversion    (Blank rows = not tested)  BED MOBILITY:  NOT TESTED- Patient reports independent  TRANSFERS: Assistive device utilized: None  Sit to stand: Complete Independence Stand to sit: Complete Independence Chair to chair: Complete Independence Floor:  Not tested  GAIT: Gait pattern: decreased arm swing- Right, decreased arm swing- Left,  decreased step length- Right, decreased step length- Left, decreased stance time- Right, decreased stance time- Left, decreased stride length, shuffling, festinating, poor foot clearance- Right, and poor foot clearance- Left Distance walked: >  100 feet Assistive device utilized:  has cane today but did not use- states only using in unfamiliar community settings Level of assistance: CGA   FUNCTIONAL TESTS:  5 times sit to stand: 18.81 sec without UE support Timed up and go (TUG): 16.03 sec avg without AD 6 minute walk test: 937 ftt 10 meter walk test: 0.79 m/s avg without AD Berg Balance Scale: 43/56  PATIENT SURVEYS:  ABC scale 75.6%                                                                                                                              TREATMENT DATE: 10/20/23   Ambulate with  heel toe focus with noticeable freezingat door frames and turns.   Dual task of walking with ball toss to self  86 ft x 2 trials Dual task of walking boucing ball to self 86 ft  x2 Twist while holding ball walking 86 ft x 2 trials   Activity Description: pods in a circle, patient in center with home base Activity Setting:  [facility] setting was selected for the goal of improving spatial awareness and visual scanning ability, establishing a central point of reference during dynamic movements for enhanced balance and control.   Number of Pods:  6 Cycles/Sets:  1 Duration (Time or Hit Count):  10  Activity Description: red=right, green=left 3 on table 3 on floor Activity Setting:  The Blaze Pod Random setting was chosen to enhance cognitive processing and agility, providing an unpredictable environment to simulate real-world scenarios, and fostering quick reactions and adaptability.   Number of Pods:  6 Cycles/Sets:  3 Duration (Time or Hit Count):  20  Sit to stand cross body punch 10x Static stand cross body punch 30 seconds  Step over theraband and clap, return back and  restart 10x each side   Speed ladder:  -one foot per square 10x; Single hand hold assist.   Seated:  Finger flick 10x   PATIENT EDUCATION: Education details: large amplitude technique, HEP,  Person educated: Patient Education method: Medical illustrator, VC printout Education comprehension: verbalized understanding, returned demonstration, and verbal cues required  HOME EXERCISE PROGRAM:  Access Code: [email or web address] URL: [email or web address] Date: 10/18/2023 Prepared by: Carlen Chasten  Exercises - Walking  - 1 x daily - 7 x weekly - 1 sets - 1 reps - 6-10 minutes hold - Sit to Stand Without Arm Support  - 1 x daily - 5-6 x weekly - 2-3 sets - 10 reps - Seated Finger Flicks with Elbow Extension  - 1 x daily - 7 x weekly - 2 sets - 20 reps - Seated Reaching to Side and Across Body  - 1 x daily - 7 x weekly - 2 sets - 10 reps  GOALS: Goals reviewed with patient? Yes  SHORT TERM GOALS: Target date: 11/14/2023  Pt will be independent with HEP in order to improve strength and balance in order to  decrease fall risk and improve function at home and work. Baseline: Goal status: INITIAL   LONG TERM GOALS: Target date: 12/26/2023  1.  Patient (> 54 years old) will complete five times sit to stand test in < 15 seconds indicating an increased LE strength and improved balance. Baseline: EVAL= 18.81 sec without UE Goal status: INITIAL  2.  Patient will improve ABC score to >80%   to demonstrate statistically significant improvement in mobility and quality of life as it relates to their confidence in his balance.  Baseline: EVAL=75.6 % Goal status: INITIAL   4.  Patient will increase Berg Balance score by > 6 points to demonstrate decreased fall risk during functional activities. Baseline: EVAL= 43/56 Goal status: INITIAL   5.   Patient will reduce timed up and go to <11 seconds to reduce fall risk and demonstrate improved transfer/gait ability. Baseline: EVAL=  16.03 without AD Goal status: INITIAL  6.   Patient will increase 10 meter walk test to >1.3m/s as to improve gait speed for better community ambulation and to reduce fall risk. Baseline: EVAL= 0.79 m/s avg without AD Goal status: INITIAL  7.   Patient will increase six minute walk test distance to >1000 for progression to community ambulator and improve gait ability Baseline: EVAL: To be tested next visit; 10/05/23: 937 ft  Goal status: INITIAL    ASSESSMENT:  CLINICAL IMPRESSION:  Patient presents with excellent motivation. Focus on "resetting" when cogwheeling to decrease freezing. Noticeable improvement of fluidity of movement with reset note. Patient has improved step length with use of visual cue of speed ladder.  Mr. Krogh will continue to benefit from skilled physical therapy intervention to address impairments, improve QOL, decrease fall risk, and attain therapy goals.    OBJECTIVE IMPAIRMENTS: Abnormal gait, decreased balance, decreased coordination, decreased endurance, decreased mobility, difficulty walking, decreased ROM, decreased strength, hypomobility, impaired flexibility, and postural dysfunction.   ACTIVITY LIMITATIONS: carrying, lifting, bending, standing, squatting, stairs, and transfers  PARTICIPATION LIMITATIONS: cleaning, shopping, community activity, and yard work  PERSONAL FACTORS: 1-2 comorbidities: HTN  are also affecting patient's functional outcome.   REHAB POTENTIAL: Good  CLINICAL DECISION MAKING: Stable/uncomplicated  EVALUATION COMPLEXITY: Low  PLAN:  PT FREQUENCY: 1-2x/week  PT DURATION: 12 weeks  PLANNED INTERVENTIONS: 97164- PT Re-evaluation, 97110-Therapeutic exercises, 97530- Therapeutic activity, 97112- Neuromuscular re-education, 97535- Self Care, 10626- Manual therapy, [phone]- Gait training, [phone]- Orthotic Fit/training, [phone]- Canalith repositioning, [phone]- Electrical stimulation (manual), Patient/Family education, Balance training,  Stair training, Taping, Dry Needling, Joint mobilization, Joint manipulation, Spinal manipulation, Spinal mobilization, Vestibular training, DME instructions, Cryotherapy, and Moist heat  PLAN FOR NEXT SESSION:  - review visual cue of chair legrest to step towards in order to decrease freezing when turning to sit  - continue dynamic gait training addressing freezing when turning - backwards gait training - dual-task challenges and/or motor sequencing challenges could use Blaze Pods - progress HEP as appropriate - Dynamic stepping balance training  - Postural strengthening/trunk and LE ROM to assist with flexibility    Markeeta Scalf  Brain Cahill, PT, DPT Physical Therapist - [facility]  Outpatient Physical Therapy- [facility] [phone]     12:32 PM 10/20/23

## 2023-10-20 ENCOUNTER — Ambulatory Visit

## 2023-10-20 DIAGNOSIS — R278 Other lack of coordination: Secondary | ICD-10-CM

## 2023-10-20 DIAGNOSIS — M6281 Muscle weakness (generalized): Secondary | ICD-10-CM

## 2023-10-20 DIAGNOSIS — R2681 Unsteadiness on feet: Secondary | ICD-10-CM

## 2023-10-20 DIAGNOSIS — R471 Dysarthria and anarthria: Secondary | ICD-10-CM | POA: Diagnosis not present

## 2023-10-20 DIAGNOSIS — R262 Difficulty in walking, not elsewhere classified: Secondary | ICD-10-CM

## 2023-10-20 DIAGNOSIS — G20A1 Parkinson's disease without dyskinesia, without mention of fluctuations: Secondary | ICD-10-CM

## 2023-10-20 NOTE — Therapy (Signed)
 OUTPATIENT SPEECH LANGUAGE PATHOLOGY PARKINSON'S TREATMENT   Patient Name: Derrick Bishop MRN: [id number] DOB:04/13/1946, 78 y.o., male Today's Date: 10/20/2023  PCP: Firman Hughes, MD REFERRING PROVIDER: Devora Folks, MD   End of Session - 10/20/23 1045     Visit Number 6    Number of Visits 24    Date for SLP Re-Evaluation 12/26/23    SLP Start Time 1100    SLP Stop Time  1145    SLP Time Calculation (min) 45 min    Activity Tolerance Patient tolerated treatment well             Past Medical History:  Diagnosis Date   Hypertension    No past surgical history on file. There are no active problems to display for this patient.   ONSET DATE:  ~3 years ago;   REFERRING DIAG: Parkinson's Disease without dyskinesia, without mention of fluctuations  THERAPY DIAG:  Dysarthria and anarthria  Rationale for Evaluation and Treatment Rehabilitation  SUBJECTIVE:   SUBJECTIVE STATEMENT: Pt alert, pleasant, and cooperative. Pt accompanied by: self  PERTINENT HISTORY: Per Neuro note, 09/15/23, "Parkinson's Disease in a patient with bilateral hand tremor (left > right) + slowness of movement + some changes in balance + hypophonia + micro graphia + hypomimia + minimal bradykinesia on the left + Mild cog wheeling on the right and moderate cog wheeling on the left + decreased arm swing on the left + slight hunched forward posture - symptomatic, without new or worsening features." Pt with hx HTN, HLD, Vit B12 and Vit deficiencies. MRI brain, 12/28/21, "no a cute infarction, hemorrhage, or mass. Mild chronic microvascular ischemic changes."    PAIN:  Are you having pain? No   FALLS: Has patient fallen in last 6 months? Defer to PT  LIVING ENVIRONMENT: Lives with: lives with their spouse Lives in: House/apartment  PLOF:  ambulates with single point cane; driving; indep  PATIENT GOALS  for speech to improve  OBJECTIVE:  TODAY'S TREATMENT:  Reviewed EMST including purpose, how  to complete ex's, and maintenance. Pt completed 5 sets of 5 reps with indep use of hand placement to facilitate adequate lip seal given Bell's Palsy.   Voice ex's completed as follows: Sustained /a/: x10, 85dB Ascending pitch glides: x10, 85dB Descending pitch glides: x10, 85db  Pt read x8 functional phrases twice through with loud and intentional voice, averaging 84dB.   During short conversational exchanges (single sentences), pt averaged 76dB, ~74dB for 2-3 sentences.  Pt benefited from use of biofeeback using Voice Analyst app.   Pt educated re: changes to speech/voice due to Parkinson's Disease, voice ex's, EMST, SLP POC, and HEP.  PATIENT EDUCATION: Education details: as above Person educated: Patient  Education method: Programmer, multimedia; Handout Education comprehension: verbalized understanding and needs further education   HOME EXERCISE PROGRAM: Voice Ex's and Functional Reading as outlined above  GOALS: Goals reviewed with patient? Yes  SHORT TERM GOALS: Target date: 10 sessions  Pt will participate in further assessment of functional cognitive-linguistic ability with additional goals to be set as appropriate. Baseline: Goal status: INITIAL  2.  The patient will complete HEP (Maximum duration "ah", High/Lows, and Functional Phrases) at average loudness >/= 80 dB and with loud, good quality voice with min cues.  Baseline: Goal status: INITIAL  3.  Pt will sustain conversational loudness (75-85 dB) for 3-5 minutes of conversation with min cues. Baseline:  Goal status: INITIAL    LONG TERM GOALS: Target date: 12 weeks  The patient  will complete HEP (Maximum duration "ah", High/Lows, and Functional Phrases) at average loudness >/= 80 dB and with loud, good quality voice indep. Baseline: Goal status: INITIAL  Pt will sustain conversational loudness (75-85 dB) for 8-10 minutes of conversation with min cues. Baseline:  Goal status: INITIAL   ASSESSMENT:  CLINICAL  IMPRESSION: Patient is a 78 y.o. male who was seen today for evaluation in setting of Parkinson's Disease. er Neuro note, 09/15/23, "Parkinson's Disease in a patient with bilateral hand tremor (left > right) + slowness of movement + some changes in balance + hypophonia + micro graphia + hypomimia + minimal bradykinesia on the left + Mild cog wheeling on the right and moderate cog wheeling on the left + decreased arm swing on the left + slight hunched forward posture - symptomatic, without new or worsening features." Pt taking carbidopa-levodopa for Parkinson's Disease with recent increased in dosage.  Pt with hx HTN, HLD, Vit B12 and Vit deficiencies. MRI brain, 12/28/21, "no a cute infarction, hemorrhage, or mass. Mild chronic microvascular ischemic changes." Pt presents with a mild hypokinetic dysarthric c/b hypophonia, mildly hoarse vocal quality and intermittent fast rushes of speech. Evidence of volume decay noted. Wife endorsed that speech is worse in home with pt often "muttering." Wife also identified x2 persons who have stopped communicating with pt due to difficulty hearing him. Pt stimulable to verbal cues and would benefit from course of ST to improve pt's ability to communicate at home and in the community.  OBJECTIVE IMPAIRMENTS include dysarthria. These impairments are limiting patient from effectively communicating at home and in community. Factors affecting potential to achieve goals and functional outcome are n/a. Patient will benefit from skilled SLP services to address above impairments and improve overall function.  REHAB POTENTIAL: Good  PLAN: SLP FREQUENCY: 1-2x/week  SLP DURATION: 12 weeks  PLANNED INTERVENTIONS: Functional tasks, Multimodal communication approach, SLP instruction and feedback, Compensatory strategies, Patient/family education, and further cognitive-linguistic assessment.   Dia Forget, M.S., CCC-SLP Speech-Language Pathologist [place] [facility] [phone] [place])   [facility] [address] [place], [place], [address] Phone: [phone]   Fax:  [phone]

## 2023-10-25 ENCOUNTER — Ambulatory Visit: Admitting: Physical Therapy

## 2023-10-25 ENCOUNTER — Ambulatory Visit

## 2023-10-25 DIAGNOSIS — R278 Other lack of coordination: Secondary | ICD-10-CM

## 2023-10-25 DIAGNOSIS — R2681 Unsteadiness on feet: Secondary | ICD-10-CM

## 2023-10-25 DIAGNOSIS — G20A1 Parkinson's disease without dyskinesia, without mention of fluctuations: Secondary | ICD-10-CM

## 2023-10-25 DIAGNOSIS — R262 Difficulty in walking, not elsewhere classified: Secondary | ICD-10-CM

## 2023-10-25 DIAGNOSIS — R471 Dysarthria and anarthria: Secondary | ICD-10-CM | POA: Diagnosis not present

## 2023-10-25 DIAGNOSIS — R269 Unspecified abnormalities of gait and mobility: Secondary | ICD-10-CM

## 2023-10-25 DIAGNOSIS — M6281 Muscle weakness (generalized): Secondary | ICD-10-CM

## 2023-10-25 NOTE — Therapy (Signed)
 OUTPATIENT SPEECH LANGUAGE PATHOLOGY PARKINSON'S TREATMENT   Patient Name: Derrick Bishop MRN: [id number] DOB:[birth year], 78 y.o., male Today's Date: 10/25/2023  PCP: Firman Hughes, MD REFERRING PROVIDER: Devora Folks, MD   End of Session - 10/25/23 1526     Visit Number 7    Number of Visits 24    Date for SLP Re-Evaluation 12/26/23    SLP Start Time 1530    SLP Stop Time  1615    SLP Time Calculation (min) 45 min    Activity Tolerance Patient tolerated treatment well             Past Medical History:  Diagnosis Date   Hypertension    No past surgical history on file. There are no active problems to display for this patient.   ONSET DATE:  ~3 years ago;   REFERRING DIAG: Parkinson's Disease without dyskinesia, without mention of fluctuations  THERAPY DIAG:  Dysarthria and anarthria  Rationale for Evaluation and Treatment Rehabilitation  SUBJECTIVE:   SUBJECTIVE STATEMENT: Pt alert, pleasant, and cooperative. Pt accompanied by: self  PERTINENT HISTORY: Per Neuro note, 09/15/23, "Parkinson's Disease in a patient with bilateral hand tremor (left > right) + slowness of movement + some changes in balance + hypophonia + micro graphia + hypomimia + minimal bradykinesia on the left + Mild cog wheeling on the right and moderate cog wheeling on the left + decreased arm swing on the left + slight hunched forward posture - symptomatic, without new or worsening features." Pt with hx HTN, HLD, Vit B12 and Vit deficiencies. MRI brain, 12/28/21, "no a cute infarction, hemorrhage, or mass. Mild chronic microvascular ischemic changes."    PAIN:  Are you having pain? No   FALLS: Has patient fallen in last 6 months? Defer to PT  LIVING ENVIRONMENT: Lives with: lives with their spouse Lives in: House/apartment  PLOF:  ambulates with single point cane; driving; indep  PATIENT GOALS  for speech to improve  OBJECTIVE:  TODAY'S TREATMENT:  Reviewed EMST including purpose, how  to complete ex's, and maintenance. Increased pressure threshold by 1/4 turn. Pt completed 5 sets of 5 reps with indep use of hand placement to facilitate adequate lip seal given Bell's Palsy.   Voice ex's completed as follows: Sustained /a/: x10, 85dB Ascending pitch glides: x10, 85dB Descending pitch glides: x10, 85db  Pt read x8 functional phrases twice through with loud and intentional voice, averaging 84dB.   During short conversational exchanges (single sentences), pt averaged 76dB. For short 2-3 minute conversation bursts, pt averaged 76dB with background noise and 74dB without. Evidence of volume decay persists.  Pt benefited from use of biofeeback using Voice Analyst app.   Pt educated re: changes to speech/voice due to Parkinson's Disease, voice ex's, EMST, SLP POC, and HEP.  PATIENT EDUCATION: Education details: as above Person educated: Patient  Education method: Programmer, multimedia; Handout Education comprehension: verbalized understanding and needs further education   HOME EXERCISE PROGRAM: Voice Ex's and Functional Reading as outlined above  GOALS: Goals reviewed with patient? Yes  SHORT TERM GOALS: Target date: 10 sessions  Pt will participate in further assessment of functional cognitive-linguistic ability with additional goals to be set as appropriate. Baseline: Goal status: INITIAL  2.  The patient will complete HEP (Maximum duration "ah", High/Lows, and Functional Phrases) at average loudness >/= 80 dB and with loud, good quality voice with min cues.  Baseline: Goal status: INITIAL  3.  Pt will sustain conversational loudness (75-85 dB) for 3-5 minutes of  conversation with min cues. Baseline:  Goal status: INITIAL    LONG TERM GOALS: Target date: 12 weeks  The patient will complete HEP (Maximum duration "ah", High/Lows, and Functional Phrases) at average loudness >/= 80 dB and with loud, good quality voice indep. Baseline: Goal status: INITIAL  Pt will  sustain conversational loudness (75-85 dB) for 8-10 minutes of conversation with min cues. Baseline:  Goal status: INITIAL   ASSESSMENT:  CLINICAL IMPRESSION: Patient is a 78 y.o. male who was seen today for evaluation in setting of Parkinson's Disease. er Neuro note, 09/15/23, "Parkinson's Disease in a patient with bilateral hand tremor (left > right) + slowness of movement + some changes in balance + hypophonia + micro graphia + hypomimia + minimal bradykinesia on the left + Mild cog wheeling on the right and moderate cog wheeling on the left + decreased arm swing on the left + slight hunched forward posture - symptomatic, without new or worsening features." Pt taking carbidopa-levodopa for Parkinson's Disease with recent increased in dosage.  Pt with hx HTN, HLD, Vit B12 and Vit deficiencies. MRI brain, 12/28/21, "no a cute infarction, hemorrhage, or mass. Mild chronic microvascular ischemic changes." Pt presents with a mild hypokinetic dysarthric c/b hypophonia, mildly hoarse vocal quality and intermittent fast rushes of speech. Evidence of volume decay noted. Wife endorsed that speech is worse in home with pt often "muttering." Wife also identified x2 persons who have stopped communicating with pt due to difficulty hearing him. Pt stimulable to verbal cues and would benefit from course of ST to improve pt's ability to communicate at home and in the community.  OBJECTIVE IMPAIRMENTS include dysarthria. These impairments are limiting patient from effectively communicating at home and in community. Factors affecting potential to achieve goals and functional outcome are n/a. Patient will benefit from skilled SLP services to address above impairments and improve overall function.  REHAB POTENTIAL: Good  PLAN: SLP FREQUENCY: 1-2x/week  SLP DURATION: 12 weeks  PLANNED INTERVENTIONS: Functional tasks, Multimodal communication approach, SLP instruction and feedback, Compensatory strategies,  Patient/family education, and further cognitive-linguistic assessment.   Dia Forget, M.S., CCC-SLP Speech-Language Pathologist [facility] [phone] [place])   [facility] at [facility] [address] [place], [place], [address] Phone: [phone]   Fax:  [phone]

## 2023-10-25 NOTE — Therapy (Signed)
 OUTPATIENT PHYSICAL THERAPY NEURO TREATMENT   Patient Name: TRAXTON CATCHING MRN: [id number] DOB:Nov 22, 1945, 78 y.o., male Today's Date: 10/25/2023   PCP: Dr. Firman Hughes REFERRING PROVIDER: Dr. Devora Folks  END OF SESSION:  PT End of Session - 10/25/23 1620     Visit Number 7    Number of Visits 24    Date for PT Re-Evaluation 12/26/23    Progress Note Due on Visit 10    PT Start Time 1619    PT Stop Time 1700    PT Time Calculation (min) 41 min    Equipment Utilized During Treatment Gait belt    Activity Tolerance Patient tolerated treatment well    Behavior During Therapy WFL for tasks assessed/performed                Past Medical History:  Diagnosis Date   Hypertension    No past surgical history on file. There are no active problems to display for this patient.   ONSET DATE: 3 years ago  REFERRING DIAG: Parkinson's disease without dyskinesia, without mention of fluctuations   THERAPY DIAG:  Other lack of coordination  Muscle weakness (generalized)  Parkinson's disease without dyskinesia or fluctuating manifestations (HCC)  Unsteadiness on feet  Difficulty in walking, not elsewhere classified  Abnormality of gait and mobility  Rationale for Evaluation and Treatment: Rehabilitation  SUBJECTIVE:                                                                                                                                                                                             SUBJECTIVE STATEMENT:  Patient reports he is doing pretty well today. No updates     Pt accompanied by: self  PERTINENT HISTORY:  History obtained by Dr. Mason Sole on 09/15/2023, the below from that note: Assessment & Plan Parkinson's Disease in a patient with bilateral hand tremor (left > right) + slowness of movement + some changes in balance + hypophonia + micro graphia + hypomimia + minimal bradykinesia on the left + Mild cog wheeling on the right and moderate cog  wheeling on the left + decreased arm swing on the left + slight hunched forward posture - symptomatic, without new or worsening features   Parkinson's disease with worsening symptoms, including increased hand tremors, balance problems, and freezing episodes. Current treatment with carbidopa-levodopa three times daily shows no noticeable symptom improvement. Increasing the dose aims to reduce tremors, bradykinesia, and rigidity. The medication is safe with no long-term damage, and higher doses may be necessary as the disease progresses. Early and adequate dosing can preserve functional years, and the highest tolerable dose  should be administered early to maximize functional outcomes. - Increase carbidopa-levodopa to 25-100 mg two times a day  Increase carbidopa-levodopa to 1.5 pills three times daily for two weeks, then increase to 2 pills three times daily. - Order Parkinson's-specific Physical Therapy and Speech Therapy   PAIN:  Are you having pain? No  PRECAUTIONS: Fall  RED FLAGS: None   WEIGHT BEARING RESTRICTIONS: No  FALLS: Has patient fallen in last 6 months? No  LIVING ENVIRONMENT: Lives with: lives with their spouse Lives in: House/apartment Stairs: Yes: External: 3 steps; In process of having railings installed in garage Has following equipment at home: Single point cane  PLOF: Independent  PATIENT GOALS: improve my walking and confidence in getting around.   OBJECTIVE:  Note: Objective measures were completed at Evaluation unless otherwise noted.  DIAGNOSTIC FINDINGS:  Narrative & Impression  CLINICAL DATA:  Neuro deficit, acute, stroke suspected   EXAM: MRI HEAD WITHOUT CONTRAST   TECHNIQUE: Multiplanar, multiecho pulse sequences of the brain and surrounding structures were obtained without intravenous contrast.   COMPARISON:  2012   FINDINGS: Brain: There is no acute infarction or intracranial hemorrhage. There is no intracranial mass, mass effect, or  edema. There is no hydrocephalus or extra-axial fluid collection. Ventricles and sulci are within normal limits in size and configuration. Patchy T2 hyperintensity in the supratentorial white matter is nonspecific but may reflect mild chronic microvascular ischemic changes.   Vascular: Major vessel flow voids at the skull base are preserved.   Skull and upper cervical spine: Normal marrow signal is preserved.   Sinuses/Orbits: Paranasal sinus mucosal thickening. Orbits are unremarkable.   Other: Sella is unremarkable.  Mastoid air cells are clear.   IMPRESSION: No acute infarction, hemorrhage, or mass.   Mild chronic microvascular ischemic changes.     Electronically Signed   By: Geannie Keener M.D.   On: 12/28/2021 13:07      COGNITION: Overall cognitive status: Within functional limits for tasks assessed   SENSATION: WFL  COORDINATION: Slow to initiate at times  EDEMA:  None observed    POSTURE: rounded shoulders and forward head  LOWER EXTREMITY ROM:     Active  Right Eval Left Eval  Hip flexion    Hip extension    Hip abduction    Hip adduction    Hip internal rotation    Hip external rotation    Knee flexion    Knee extension    Ankle dorsiflexion    Ankle plantarflexion    Ankle inversion    Ankle eversion     (Blank rows = not tested)  LOWER EXTREMITY MMT:    MMT Right Eval Left Eval  Hip flexion 4 4  Hip extension 4 4  Hip abduction 4 4  Hip adduction    Hip internal rotation 4 4  Hip external rotation 4 4  Knee flexion 4 4  Knee extension 4 4  Ankle dorsiflexion 4 4  Ankle plantarflexion    Ankle inversion    Ankle eversion    (Blank rows = not tested)  BED MOBILITY:  NOT TESTED- Patient reports independent  TRANSFERS: Assistive device utilized: None  Sit to stand: Complete Independence Stand to sit: Complete Independence Chair to chair: Complete Independence Floor:  Not tested  GAIT: Gait pattern: decreased arm  swing- Right, decreased arm swing- Left, decreased step length- Right, decreased step length- Left, decreased stance time- Right, decreased stance time- Left, decreased stride length, shuffling, festinating, poor foot clearance-  Right, and poor foot clearance- Left Distance walked: > 100 feet Assistive device utilized:  has cane today but did not use- states only using in unfamiliar community settings Level of assistance: CGA   FUNCTIONAL TESTS:  5 times sit to stand: 18.81 sec without UE support Timed up and go (TUG): 16.03 sec avg without AD 6 minute walk test: 937 ftt 10 meter walk test: 0.79 m/s avg without AD Berg Balance Scale: 43/56  PATIENT SURVEYS:  ABC scale 75.6%                                                                                                                              TREATMENT DATE: 10/25/23    Ambulate with  heel toe focus, no freezing or festination through doorways x 372ft.   Stepping forward over cane x 15  Lateral stepping over cane with lateral reach x 12 bil   Stepping over half bolster with BUE forward reach and finger flick x 12 bil  Lateral stepping with BLE over bolster. X 12 bil   Turn training with clock face visual in hall way. Walking 15-7ft 180deg to the R x 6 and L x 6  Walking 139ft x 4 bouts in hall while performing 360deg turn x 6 bil  Instruction for stepping BLE on 3, 6, 9 and 12 respectively to reduce festination and decrease step count in turns. Min cues for improved force of steps to the L to clear floor and prevent foot drag.    Weighted gait with 3# AW and 2# WR x 351ft. Min assist from PT throughout for full reciprocal pattern through the LUE. Noted to have decreased step length with fatigue     PATIENT EDUCATION: Education details: large amplitude technique, HEP,  Person educated: Patient Education method: Medical illustrator, VC printout Education comprehension: verbalized understanding, returned  demonstration, and verbal cues required  HOME EXERCISE PROGRAM:  Access Code: [email or web address] URL: [email or web address] Date: 10/18/2023 Prepared by: Carlen Chasten  Exercises - Walking  - 1 x daily - 7 x weekly - 1 sets - 1 reps - 6-10 minutes hold - Sit to Stand Without Arm Support  - 1 x daily - 5-6 x weekly - 2-3 sets - 10 reps - Seated Finger Flicks with Elbow Extension  - 1 x daily - 7 x weekly - 2 sets - 20 reps - Seated Reaching to Side and Across Body  - 1 x daily - 7 x weekly - 2 sets - 10 reps  GOALS: Goals reviewed with patient? Yes  SHORT TERM GOALS: Target date: 11/14/2023  Pt will be independent with HEP in order to improve strength and balance in order to decrease fall risk and improve function at home and work. Baseline: Goal status: INITIAL   LONG TERM GOALS: Target date: 12/26/2023  1.  Patient (> 47 years old) will complete five times sit to stand test in < 15 seconds indicating an increased LE  strength and improved balance. Baseline: EVAL= 18.81 sec without UE Goal status: INITIAL  2.  Patient will improve ABC score to >80%   to demonstrate statistically significant improvement in mobility and quality of life as it relates to their confidence in his balance.  Baseline: EVAL=75.6 % Goal status: INITIAL   4.  Patient will increase Berg Balance score by > 6 points to demonstrate decreased fall risk during functional activities. Baseline: EVAL= 43/56 Goal status: INITIAL   5.   Patient will reduce timed up and go to <11 seconds to reduce fall risk and demonstrate improved transfer/gait ability. Baseline: EVAL= 16.03 without AD Goal status: INITIAL  6.   Patient will increase 10 meter walk test to >1.36m/s as to improve gait speed for better community ambulation and to reduce fall risk. Baseline: EVAL= 0.79 m/s avg without AD Goal status: INITIAL  7.   Patient will increase six minute walk test distance to >1000 for progression to community  ambulator and improve gait ability Baseline: EVAL: To be tested next visit; 10/05/23: 937 ft  Goal status: INITIAL    ASSESSMENT:  CLINICAL IMPRESSION:  Patient presents with excellent motivation. PT treatment focused on large amplitude movement and improved turning technique to reduce festination in turns. Responded well to Clock visual for decrease number of steps for 90, 180, and 360deg turns. Increased difficulty with turn to the L than R< bit improved with repetitions.  Mr. Daft will continue to benefit from skilled physical therapy intervention to address impairments, improve QOL, decrease fall risk, and attain therapy goals.    OBJECTIVE IMPAIRMENTS: Abnormal gait, decreased balance, decreased coordination, decreased endurance, decreased mobility, difficulty walking, decreased ROM, decreased strength, hypomobility, impaired flexibility, and postural dysfunction.   ACTIVITY LIMITATIONS: carrying, lifting, bending, standing, squatting, stairs, and transfers  PARTICIPATION LIMITATIONS: cleaning, shopping, community activity, and yard work  PERSONAL FACTORS: 1-2 comorbidities: HTN  are also affecting patient's functional outcome.   REHAB POTENTIAL: Good  CLINICAL DECISION MAKING: Stable/uncomplicated  EVALUATION COMPLEXITY: Low  PLAN:  PT FREQUENCY: 1-2x/week  PT DURATION: 12 weeks  PLANNED INTERVENTIONS: 97164- PT Re-evaluation, 97110-Therapeutic exercises, 97530- Therapeutic activity, 97112- Neuromuscular re-education, 97535- Self Care, 16109- Manual therapy, [phone]- Gait training, [phone]- Orthotic Fit/training, [phone]- Canalith repositioning, [phone]- Electrical stimulation (manual), Patient/Family education, Balance training, Stair training, Taping, Dry Needling, Joint mobilization, Joint manipulation, Spinal manipulation, Spinal mobilization, Vestibular training, DME instructions, Cryotherapy, and Moist heat  PLAN FOR NEXT SESSION:   - continue dynamic gait training addressing  freezing when turning - backwards gait training - dual-task challenges and/or motor sequencing challenges could use Blaze Pods - progress HEP as appropriate - Dynamic stepping balance training  - Postural strengthening/trunk and LE ROM to assist with flexibility  Aurora Lees PT, DPT  Physical Therapist - [facility]  [facility]  5:25 PM 10/25/23

## 2023-10-27 ENCOUNTER — Ambulatory Visit: Admitting: Physical Therapy

## 2023-10-27 ENCOUNTER — Ambulatory Visit: Attending: Neurology

## 2023-10-27 DIAGNOSIS — R269 Unspecified abnormalities of gait and mobility: Secondary | ICD-10-CM

## 2023-10-27 DIAGNOSIS — R278 Other lack of coordination: Secondary | ICD-10-CM | POA: Insufficient documentation

## 2023-10-27 DIAGNOSIS — R2681 Unsteadiness on feet: Secondary | ICD-10-CM | POA: Diagnosis present

## 2023-10-27 DIAGNOSIS — R262 Difficulty in walking, not elsewhere classified: Secondary | ICD-10-CM

## 2023-10-27 DIAGNOSIS — R471 Dysarthria and anarthria: Secondary | ICD-10-CM | POA: Insufficient documentation

## 2023-10-27 DIAGNOSIS — M6281 Muscle weakness (generalized): Secondary | ICD-10-CM | POA: Diagnosis present

## 2023-10-27 DIAGNOSIS — G20A1 Parkinson's disease without dyskinesia, without mention of fluctuations: Secondary | ICD-10-CM

## 2023-10-27 NOTE — Therapy (Signed)
 OUTPATIENT PHYSICAL THERAPY NEURO TREATMENT   Patient Name: Derrick Bishop MRN: [id number] DOB:Nov 14, 1945, 78 y.o., male Today's Date: 10/27/2023   PCP: Dr. Firman Hughes REFERRING PROVIDER: Dr. Devora Folks  END OF SESSION:  PT End of Session - 10/27/23 4098     Visit Number 8    Number of Visits 24    Date for PT Re-Evaluation 12/26/23    Progress Note Due on Visit 10    PT Start Time 0934    PT Stop Time 1014    PT Time Calculation (min) 40 min    Equipment Utilized During Treatment Gait belt    Activity Tolerance Patient tolerated treatment well    Behavior During Therapy WFL for tasks assessed/performed                Past Medical History:  Diagnosis Date   Hypertension    No past surgical history on file. There are no active problems to display for this patient.   ONSET DATE: 3 years ago  REFERRING DIAG: Parkinson's disease without dyskinesia, without mention of fluctuations   THERAPY DIAG:  Other lack of coordination  Muscle weakness (generalized)  Parkinson's disease without dyskinesia or fluctuating manifestations (HCC)  Unsteadiness on feet  Difficulty in walking, not elsewhere classified  Abnormality of gait and mobility  Rationale for Evaluation and Treatment: Rehabilitation  SUBJECTIVE:                                                                                                                                                                                             SUBJECTIVE STATEMENT:  Patient reports he is doing pretty well today. No updates  no falls since last PT visit   Pt accompanied by: self  PERTINENT HISTORY:  History obtained by Dr. Mason Sole on 09/15/2023, the below from that note: Assessment & Plan Parkinson's Disease in a patient with bilateral hand tremor (left > right) + slowness of movement + some changes in balance + hypophonia + micro graphia + hypomimia + minimal bradykinesia on the left + Mild cog wheeling on the  right and moderate cog wheeling on the left + decreased arm swing on the left + slight hunched forward posture - symptomatic, without new or worsening features   Parkinson's disease with worsening symptoms, including increased hand tremors, balance problems, and freezing episodes. Current treatment with carbidopa-levodopa three times daily shows no noticeable symptom improvement. Increasing the dose aims to reduce tremors, bradykinesia, and rigidity. The medication is safe with no long-term damage, and higher doses may be necessary as the disease progresses. Early and adequate dosing can preserve functional years,  and the highest tolerable dose should be administered early to maximize functional outcomes. - Increase carbidopa-levodopa to 25-100 mg two times a day  Increase carbidopa-levodopa to 1.5 pills three times daily for two weeks, then increase to 2 pills three times daily. - Order Parkinson's-specific Physical Therapy and Speech Therapy   PAIN:  Are you having pain? No  PRECAUTIONS: Fall  RED FLAGS: None   WEIGHT BEARING RESTRICTIONS: No  FALLS: Has patient fallen in last 6 months? No  LIVING ENVIRONMENT: Lives with: lives with their spouse Lives in: House/apartment Stairs: Yes: External: 3 steps; In process of having railings installed in garage Has following equipment at home: Single point cane  PLOF: Independent  PATIENT GOALS: improve my walking and confidence in getting around.   OBJECTIVE:  Note: Objective measures were completed at Evaluation unless otherwise noted.  DIAGNOSTIC FINDINGS:  Narrative & Impression  CLINICAL DATA:  Neuro deficit, acute, stroke suspected   EXAM: MRI HEAD WITHOUT CONTRAST   TECHNIQUE: Multiplanar, multiecho pulse sequences of the brain and surrounding structures were obtained without intravenous contrast.   COMPARISON:  2012   FINDINGS: Brain: There is no acute infarction or intracranial hemorrhage. There is no intracranial  mass, mass effect, or edema. There is no hydrocephalus or extra-axial fluid collection. Ventricles and sulci are within normal limits in size and configuration. Patchy T2 hyperintensity in the supratentorial white matter is nonspecific but may reflect mild chronic microvascular ischemic changes.   Vascular: Major vessel flow voids at the skull base are preserved.   Skull and upper cervical spine: Normal marrow signal is preserved.   Sinuses/Orbits: Paranasal sinus mucosal thickening. Orbits are unremarkable.   Other: Sella is unremarkable.  Mastoid air cells are clear.   IMPRESSION: No acute infarction, hemorrhage, or mass.   Mild chronic microvascular ischemic changes.     Electronically Signed   By: Geannie Keener M.D.   On: 12/28/2021 13:07      COGNITION: Overall cognitive status: Within functional limits for tasks assessed   SENSATION: WFL  COORDINATION: Slow to initiate at times  EDEMA:  None observed    POSTURE: rounded shoulders and forward head  LOWER EXTREMITY ROM:     Active  Right Eval Left Eval  Hip flexion    Hip extension    Hip abduction    Hip adduction    Hip internal rotation    Hip external rotation    Knee flexion    Knee extension    Ankle dorsiflexion    Ankle plantarflexion    Ankle inversion    Ankle eversion     (Blank rows = not tested)  LOWER EXTREMITY MMT:    MMT Right Eval Left Eval  Hip flexion 4 4  Hip extension 4 4  Hip abduction 4 4  Hip adduction    Hip internal rotation 4 4  Hip external rotation 4 4  Knee flexion 4 4  Knee extension 4 4  Ankle dorsiflexion 4 4  Ankle plantarflexion    Ankle inversion    Ankle eversion    (Blank rows = not tested)  BED MOBILITY:  NOT TESTED- Patient reports independent  TRANSFERS: Assistive device utilized: None  Sit to stand: Complete Independence Stand to sit: Complete Independence Chair to chair: Complete Independence Floor:  Not tested  GAIT: Gait  pattern: decreased arm swing- Right, decreased arm swing- Left, decreased step length- Right, decreased step length- Left, decreased stance time- Right, decreased stance time- Left, decreased stride length,  shuffling, festinating, poor foot clearance- Right, and poor foot clearance- Left Distance walked: > 100 feet Assistive device utilized:  has cane today but did not use- states only using in unfamiliar community settings Level of assistance: CGA   FUNCTIONAL TESTS:  5 times sit to stand: 18.81 sec without UE support Timed up and go (TUG): 16.03 sec avg without AD 6 minute walk test: 937 ftt 10 meter walk test: 0.79 m/s avg without AD Berg Balance Scale: 43/56  PATIENT SURVEYS:  ABC scale 75.6%                                                                                                                              TREATMENT DATE: 10/27/23    Octane reciprocal movement training x 6 min leg press mod, cues for consistent SPM through varied resistance to improve interval large amplitude movements.   1 foot on 6inch step, lateral and cross body reach to move magnets across board x 12 bil,  Lateral step ups x 12 bil with light UE support   PT donned 4# AW.  Ambulate with  heel toe focus, no freezing or festination through doorways x 483ft x 2 bouts with therapeutic rest break between bouts.  Forward reverse 64ft x 2 bouts with AW Forward foot tap 6inch step x 12 bil.  Side stepping at rail 26ft x 5 bil with 4# AW.   Throughout session, cues from PT for improved step height and heel contact CGA for safety with dynamic gait training.      PATIENT EDUCATION: Education details: large amplitude technique, HEP,  Person educated: Patient Education method: Medical illustrator, VC printout Education comprehension: verbalized understanding, returned demonstration, and verbal cues required  HOME EXERCISE PROGRAM:  Access Code: [email or web address] URL:  [email or web address] Date: 10/18/2023 Prepared by: Carlen Chasten  Exercises - Walking  - 1 x daily - 7 x weekly - 1 sets - 1 reps - 6-10 minutes hold - Sit to Stand Without Arm Support  - 1 x daily - 5-6 x weekly - 2-3 sets - 10 reps - Seated Finger Flicks with Elbow Extension  - 1 x daily - 7 x weekly - 2 sets - 20 reps - Seated Reaching to Side and Across Body  - 1 x daily - 7 x weekly - 2 sets - 10 reps  GOALS: Goals reviewed with patient? Yes  SHORT TERM GOALS: Target date: 11/14/2023  Pt will be independent with HEP in order to improve strength and balance in order to decrease fall risk and improve function at home and work. Baseline: Goal status: INITIAL   LONG TERM GOALS: Target date: 12/26/2023  1.  Patient (> 57 years old) will complete five times sit to stand test in < 15 seconds indicating an increased LE strength and improved balance. Baseline: EVAL= 18.81 sec without UE Goal status: INITIAL  2.  Patient will improve ABC score to >80%   to demonstrate statistically  significant improvement in mobility and quality of life as it relates to their confidence in his balance.  Baseline: EVAL=75.6 % Goal status: INITIAL   4.  Patient will increase Berg Balance score by > 6 points to demonstrate decreased fall risk during functional activities. Baseline: EVAL= 43/56 Goal status: INITIAL   5.   Patient will reduce timed up and go to <11 seconds to reduce fall risk and demonstrate improved transfer/gait ability. Baseline: EVAL= 16.03 without AD Goal status: INITIAL  6.   Patient will increase 10 meter walk test to >1.82m/s as to improve gait speed for better community ambulation and to reduce fall risk. Baseline: EVAL= 0.79 m/s avg without AD Goal status: INITIAL  7.   Patient will increase six minute walk test distance to >1000 for progression to community ambulator and improve gait ability Baseline: EVAL: To be tested next visit; 10/05/23: 937 ft  Goal  status: INITIAL    ASSESSMENT:  CLINICAL IMPRESSION:  Patient presents with excellent motivation. PT treatment focused on large amplitude movement and improved heel contact with reciprocal gait pattern. Increased difficulty with full step length on this day, but able break festination with gait initiation with greater ease.  Mr. Miracle will continue to benefit from skilled physical therapy intervention to address impairments, improve QOL, decrease fall risk, and attain therapy goals.    OBJECTIVE IMPAIRMENTS: Abnormal gait, decreased balance, decreased coordination, decreased endurance, decreased mobility, difficulty walking, decreased ROM, decreased strength, hypomobility, impaired flexibility, and postural dysfunction.   ACTIVITY LIMITATIONS: carrying, lifting, bending, standing, squatting, stairs, and transfers  PARTICIPATION LIMITATIONS: cleaning, shopping, community activity, and yard work  PERSONAL FACTORS: 1-2 comorbidities: HTN  are also affecting patient's functional outcome.   REHAB POTENTIAL: Good  CLINICAL DECISION MAKING: Stable/uncomplicated  EVALUATION COMPLEXITY: Low  PLAN:  PT FREQUENCY: 1-2x/week  PT DURATION: 12 weeks  PLANNED INTERVENTIONS: 97164- PT Re-evaluation, 97110-Therapeutic exercises, 97530- Therapeutic activity, [id number]- Neuromuscular re-education, 97535- Self Care, 16109- Manual therapy, [id number]- Gait training, [phone]- Orthotic Fit/training, [phone]- Canalith repositioning, [phone]- Electrical stimulation (manual), Patient/Family education, Balance training, Stair training, Taping, Dry Needling, Joint mobilization, Joint manipulation, Spinal manipulation, Spinal mobilization, Vestibular training, DME instructions, Cryotherapy, and Moist heat  PLAN FOR NEXT SESSION:   - continue dynamic gait training addressing freezing when turning; using clock face reference.  - backwards gait training - dual-task challenges and/or motor sequencing challenges could use Blaze  Pods - progress HEP as appropriate - Postural strengthening/trunk and LE ROM to assist with flexibility  Aurora Lees PT, DPT  Physical Therapist - [facility]  [facility]  9:34 AM 10/27/23

## 2023-10-27 NOTE — Therapy (Signed)
 OUTPATIENT SPEECH LANGUAGE PATHOLOGY PARKINSON'S TREATMENT   Patient Name: Derrick Bishop MRN: [id number] DOB:09/26/1945, 78 y.o., male Today's Date: 10/27/2023  PCP: Firman Hughes, MD REFERRING PROVIDER: Devora Folks, MD   End of Session - 10/27/23 1059     Visit Number 8    Number of Visits 24    Date for SLP Re-Evaluation 12/26/23    SLP Start Time 1015    SLP Stop Time  1100    SLP Time Calculation (min) 45 min    Activity Tolerance Patient tolerated treatment well             Past Medical History:  Diagnosis Date   Hypertension    No past surgical history on file. There are no active problems to display for this patient.   ONSET DATE:  ~3 years ago;   REFERRING DIAG: Parkinson's Disease without dyskinesia, without mention of fluctuations  THERAPY DIAG:  Parkinson's disease without dyskinesia or fluctuating manifestations (HCC)  Rationale for Evaluation and Treatment Rehabilitation  SUBJECTIVE:   SUBJECTIVE STATEMENT: Pt alert, pleasant, and cooperative. Pt accompanied by: self  PERTINENT HISTORY: Per Neuro note, 09/15/23, "Parkinson's Disease in a patient with bilateral hand tremor (left > right) + slowness of movement + some changes in balance + hypophonia + micro graphia + hypomimia + minimal bradykinesia on the left + Mild cog wheeling on the right and moderate cog wheeling on the left + decreased arm swing on the left + slight hunched forward posture - symptomatic, without new or worsening features." Pt with hx HTN, HLD, Vit B12 and Vit deficiencies. MRI brain, 12/28/21, "no a cute infarction, hemorrhage, or mass. Mild chronic microvascular ischemic changes."    PAIN:  Are you having pain? No   FALLS: Has patient fallen in last 6 months? Defer to PT  LIVING ENVIRONMENT: Lives with: lives with their spouse Lives in: House/apartment  PLOF:  ambulates with single point cane; driving; indep  PATIENT GOALS  for speech to improve  OBJECTIVE:  TODAY'S  TREATMENT:  Reviewed EMST including purpose, how to complete ex's, and maintenance. Pt completed 5 sets of 5 reps with indep use of hand placement to facilitate adequate lip seal given Bell's Palsy.   Voice ex's completed as follows: Sustained /a/: x10, 85dB Ascending pitch glides: x10, 85dB Descending pitch glides: x10, 85db  Pt read x8 functional phrases twice through with loud and intentional voice, averaging 85dB.   During short conversational exchanges (single sentences), pt averaged 76dB. For short 2-3 minute conversation bursts, pt averaged 76dB.   Pt benefited from use of biofeeback using Voice Analyst app.   Pt educated re: changes to speech/voice due to Parkinson's Disease, voice ex's, EMST, SLP POC, and HEP.  PATIENT EDUCATION: Education details: as above Person educated: Patient  Education method: Programmer, multimedia; Handout Education comprehension: verbalized understanding and needs further education   HOME EXERCISE PROGRAM: Voice Ex's and Functional Reading as outlined above  GOALS: Goals reviewed with patient? Yes  SHORT TERM GOALS: Target date: 10 sessions  Pt will participate in further assessment of functional cognitive-linguistic ability with additional goals to be set as appropriate. Baseline: Goal status: INITIAL  2.  The patient will complete HEP (Maximum duration "ah", High/Lows, and Functional Phrases) at average loudness >/= 80 dB and with loud, good quality voice with min cues.  Baseline: Goal status: INITIAL  3.  Pt will sustain conversational loudness (75-85 dB) for 3-5 minutes of conversation with min cues. Baseline:  Goal status: INITIAL  LONG TERM GOALS: Target date: 12 weeks  The patient will complete HEP (Maximum duration "ah", High/Lows, and Functional Phrases) at average loudness >/= 80 dB and with loud, good quality voice indep. Baseline: Goal status: INITIAL  Pt will sustain conversational loudness (75-85 dB) for 8-10 minutes of  conversation with min cues. Baseline:  Goal status: INITIAL   ASSESSMENT:  CLINICAL IMPRESSION: Patient is a 78 y.o. male who was seen today for evaluation in setting of Parkinson's Disease. er Neuro note, 09/15/23, "Parkinson's Disease in a patient with bilateral hand tremor (left > right) + slowness of movement + some changes in balance + hypophonia + micro graphia + hypomimia + minimal bradykinesia on the left + Mild cog wheeling on the right and moderate cog wheeling on the left + decreased arm swing on the left + slight hunched forward posture - symptomatic, without new or worsening features." Pt taking carbidopa-levodopa for Parkinson's Disease with recent increased in dosage.  Pt with hx HTN, HLD, Vit B12 and Vit deficiencies. MRI brain, 12/28/21, "no a cute infarction, hemorrhage, or mass. Mild chronic microvascular ischemic changes." Pt presents with a mild hypokinetic dysarthric c/b hypophonia, mildly hoarse vocal quality and intermittent fast rushes of speech. Evidence of volume decay noted. Wife endorsed that speech is worse in home with pt often "muttering." Wife also identified x2 persons who have stopped communicating with pt due to difficulty hearing him. Pt stimulable to verbal cues and would benefit from course of ST to improve pt's ability to communicate at home and in the community.  OBJECTIVE IMPAIRMENTS include dysarthria. These impairments are limiting patient from effectively communicating at home and in community. Factors affecting potential to achieve goals and functional outcome are n/a. Patient will benefit from skilled SLP services to address above impairments and improve overall function.  REHAB POTENTIAL: Good  PLAN: SLP FREQUENCY: 1-2x/week  SLP DURATION: 12 weeks  PLANNED INTERVENTIONS: Functional tasks, Multimodal communication approach, SLP instruction and feedback, Compensatory strategies, Patient/family education, and further cognitive-linguistic  assessment.   Dia Forget, M.S., CCC-SLP Speech-Language Pathologist [facility] [phone] [place])   [facility] Outpatient Rehabilitation at [facility] [address] [place], [place], [address] Phone: [phone]   Fax:  [phone]

## 2023-10-31 ENCOUNTER — Ambulatory Visit

## 2023-10-31 ENCOUNTER — Ambulatory Visit: Admitting: Physical Therapy

## 2023-10-31 DIAGNOSIS — R262 Difficulty in walking, not elsewhere classified: Secondary | ICD-10-CM

## 2023-10-31 DIAGNOSIS — G20A1 Parkinson's disease without dyskinesia, without mention of fluctuations: Secondary | ICD-10-CM

## 2023-10-31 DIAGNOSIS — R471 Dysarthria and anarthria: Secondary | ICD-10-CM

## 2023-10-31 DIAGNOSIS — R278 Other lack of coordination: Secondary | ICD-10-CM

## 2023-10-31 DIAGNOSIS — R2681 Unsteadiness on feet: Secondary | ICD-10-CM

## 2023-10-31 DIAGNOSIS — R269 Unspecified abnormalities of gait and mobility: Secondary | ICD-10-CM

## 2023-10-31 DIAGNOSIS — M6281 Muscle weakness (generalized): Secondary | ICD-10-CM

## 2023-10-31 NOTE — Therapy (Signed)
 OUTPATIENT SPEECH LANGUAGE PATHOLOGY PARKINSON'S TREATMENT   Patient Name: Derrick Bishop MRN: [id number] DOB:1945-07-14, 78 y.o., male Today's Date: 10/31/2023  PCP: Firman Hughes, MD REFERRING PROVIDER: Devora Folks, MD   End of Session - 10/31/23 1617     Visit Number 9    Number of Visits 24    Date for SLP Re-Evaluation 12/26/23    SLP Start Time 1530    SLP Stop Time  1405    SLP Time Calculation (min) 1355 min             Past Medical History:  Diagnosis Date   Hypertension    No past surgical history on file. There are no active problems to display for this patient.   ONSET DATE:  ~3 years ago;   REFERRING DIAG: Parkinson's Disease without dyskinesia, without mention of fluctuations  THERAPY DIAG:  Dysarthria and anarthria  Rationale for Evaluation and Treatment Rehabilitation  SUBJECTIVE:   SUBJECTIVE STATEMENT: Pt alert, pleasant, and cooperative. Pt accompanied by: self  PERTINENT HISTORY: Per Neuro note, 09/15/23, "Parkinson's Disease in a patient with bilateral hand tremor (left > right) + slowness of movement + some changes in balance + hypophonia + micro graphia + hypomimia + minimal bradykinesia on the left + Mild cog wheeling on the right and moderate cog wheeling on the left + decreased arm swing on the left + slight hunched forward posture - symptomatic, without new or worsening features." Pt with hx HTN, HLD, Vit B12 and Vit deficiencies. MRI brain, 12/28/21, "no a cute infarction, hemorrhage, or mass. Mild chronic microvascular ischemic changes."    PAIN:  Are you having pain? No   FALLS: Has patient fallen in last 6 months? Defer to PT  LIVING ENVIRONMENT: Lives with: lives with their spouse Lives in: House/apartment  PLOF:  ambulates with single point cane; driving; indep  PATIENT GOALS  for speech to improve  OBJECTIVE:  TODAY'S TREATMENT:  Reviewed EMST including purpose, how to complete ex's, and maintenance. Increased pressure  threshold by 1/4 turn. Pt completed 5 sets of 5 reps with indep use of hand placement to facilitate adequate lip seal given Bell's Palsy.   Voice ex's completed as follows: Sustained /a/: x10, 86dB Ascending pitch glides x10, 85dB Descending pitch glides: x10, 85db  Pt read x8 functional phrases twice through with loud and intentional voice, averaging 84dB.   During short conversational exchanges (single sentences), pt averaged 76dB. For short 2-3 minute conversation bursts, pt averaged 74dB. Evidence of volume decay persists.  Pt benefited from use of biofeeback using Voice Analyst app.   Pt educated re: changes to speech/voice due to Parkinson's Disease, voice ex's, EMST, SLP POC, and HEP.  PATIENT EDUCATION: Education details: as above Person educated: Patient  Education method: Programmer, multimedia; Handout Education comprehension: verbalized understanding and needs further education   HOME EXERCISE PROGRAM: Voice Ex's and Functional Reading as outlined above  GOALS: Goals reviewed with patient? Yes  SHORT TERM GOALS: Target date: 10 sessions  Pt will participate in further assessment of functional cognitive-linguistic ability with additional goals to be set as appropriate. Baseline: Goal status: INITIAL  2.  The patient will complete HEP (Maximum duration "ah", High/Lows, and Functional Phrases) at average loudness >/= 80 dB and with loud, good quality voice with min cues.  Baseline: Goal status: INITIAL  3.  Pt will sustain conversational loudness (75-85 dB) for 3-5 minutes of conversation with min cues. Baseline:  Goal status: INITIAL    LONG TERM GOALS:  Target date: 12 weeks  The patient will complete HEP (Maximum duration "ah", High/Lows, and Functional Phrases) at average loudness >/= 80 dB and with loud, good quality voice indep. Baseline: Goal status: INITIAL  Pt will sustain conversational loudness (75-85 dB) for 8-10 minutes of conversation with min  cues. Baseline:  Goal status: INITIAL   ASSESSMENT:  CLINICAL IMPRESSION: Patient is a 78 y.o. male who was seen today for evaluation in setting of Parkinson's Disease. er Neuro note, 09/15/23, "Parkinson's Disease in a patient with bilateral hand tremor (left > right) + slowness of movement + some changes in balance + hypophonia + micro graphia + hypomimia + minimal bradykinesia on the left + Mild cog wheeling on the right and moderate cog wheeling on the left + decreased arm swing on the left + slight hunched forward posture - symptomatic, without new or worsening features." Pt taking carbidopa-levodopa for Parkinson's Disease with recent increased in dosage.  Pt with hx HTN, HLD, Vit B12 and Vit deficiencies. MRI brain, 12/28/21, "no a cute infarction, hemorrhage, or mass. Mild chronic microvascular ischemic changes." Pt presents with a mild hypokinetic dysarthric c/b hypophonia, mildly hoarse vocal quality and intermittent fast rushes of speech. Evidence of volume decay noted. Wife endorsed that speech is worse in home with pt often "muttering." Wife also identified x2 persons who have stopped communicating with pt due to difficulty hearing him. Pt stimulable to verbal cues and would benefit from course of ST to improve pt's ability to communicate at home and in the community.  OBJECTIVE IMPAIRMENTS include dysarthria. These impairments are limiting patient from effectively communicating at home and in community. Factors affecting potential to achieve goals and functional outcome are n/a. Patient will benefit from skilled SLP services to address above impairments and improve overall function.  REHAB POTENTIAL: Good  PLAN: SLP FREQUENCY: 1-2x/week  SLP DURATION: 12 weeks  PLANNED INTERVENTIONS: Functional tasks, Multimodal communication approach, SLP instruction and feedback, Compensatory strategies, Patient/family education, and further cognitive-linguistic assessment.   Dia Forget,  M.S., CCC-SLP Speech-Language Pathologist [facility] [phone] [place] Clayman)   [facility] Outpatient Rehabilitation at [facility] [address] [place], [place], [address] Phone: [phone]   Fax:  [phone]

## 2023-10-31 NOTE — Therapy (Unsigned)
 OUTPATIENT PHYSICAL THERAPY NEURO TREATMENT   Patient Name: Derrick Bishop MRN: [id number] DOB:Nov 02, 1945, 78 y.o., male Today's Date: 10/31/2023   PCP: Dr. Firman Hughes REFERRING PROVIDER: Dr. Devora Folks  END OF SESSION:  PT End of Session - 10/31/23 1731     Visit Number 9    Number of Visits 24    Date for PT Re-Evaluation 12/26/23    Progress Note Due on Visit 10    PT Start Time 1620    PT Stop Time 1700    PT Time Calculation (min) 40 min    Equipment Utilized During Treatment Gait belt    Activity Tolerance Patient tolerated treatment well    Behavior During Therapy WFL for tasks assessed/performed                Past Medical History:  Diagnosis Date   Hypertension    No past surgical history on file. There are no active problems to display for this patient.   ONSET DATE: 3 years ago  REFERRING DIAG: Parkinson's disease without dyskinesia, without mention of fluctuations   THERAPY DIAG:  Other lack of coordination  Muscle weakness (generalized)  Unsteadiness on feet  Difficulty in walking, not elsewhere classified  Parkinson's disease without dyskinesia or fluctuating manifestations (HCC)  Abnormality of gait and mobility  Rationale for Evaluation and Treatment: Rehabilitation  SUBJECTIVE:                                                                                                                                                                                             SUBJECTIVE STATEMENT:  Patient reports he is doing pretty well today. No updates  no falls since last PT visit   Pt accompanied by: self  PERTINENT HISTORY:  History obtained by Dr. Mason Sole on 09/15/2023, the below from that note: Assessment & Plan Parkinson's Disease in a patient with bilateral hand tremor (left > right) + slowness of movement + some changes in balance + hypophonia + micro graphia + hypomimia + minimal bradykinesia on the left + Mild cog wheeling on the  right and moderate cog wheeling on the left + decreased arm swing on the left + slight hunched forward posture - symptomatic, without new or worsening features   Parkinson's disease with worsening symptoms, including increased hand tremors, balance problems, and freezing episodes. Current treatment with carbidopa-levodopa three times daily shows no noticeable symptom improvement. Increasing the dose aims to reduce tremors, bradykinesia, and rigidity. The medication is safe with no long-term damage, and higher doses may be necessary as the disease progresses. Early and adequate dosing can preserve functional years,  and the highest tolerable dose should be administered early to maximize functional outcomes. - Increase carbidopa-levodopa to 25-100 mg two times a day  Increase carbidopa-levodopa to 1.5 pills three times daily for two weeks, then increase to 2 pills three times daily. - Order Parkinson's-specific Physical Therapy and Speech Therapy   PAIN:  Are you having pain? No  PRECAUTIONS: Fall  RED FLAGS: None   WEIGHT BEARING RESTRICTIONS: No  FALLS: Has patient fallen in last 6 months? No  LIVING ENVIRONMENT: Lives with: lives with their spouse Lives in: House/apartment Stairs: Yes: External: 3 steps; In process of having railings installed in garage Has following equipment at home: Single point cane  PLOF: Independent  PATIENT GOALS: improve my walking and confidence in getting around.   OBJECTIVE:  Note: Objective measures were completed at Evaluation unless otherwise noted.  DIAGNOSTIC FINDINGS:  Narrative & Impression  CLINICAL DATA:  Neuro deficit, acute, stroke suspected   EXAM: MRI HEAD WITHOUT CONTRAST   TECHNIQUE: Multiplanar, multiecho pulse sequences of the brain and surrounding structures were obtained without intravenous contrast.   COMPARISON:  2012   FINDINGS: Brain: There is no acute infarction or intracranial hemorrhage. There is no intracranial  mass, mass effect, or edema. There is no hydrocephalus or extra-axial fluid collection. Ventricles and sulci are within normal limits in size and configuration. Patchy T2 hyperintensity in the supratentorial white matter is nonspecific but may reflect mild chronic microvascular ischemic changes.   Vascular: Major vessel flow voids at the skull base are preserved.   Skull and upper cervical spine: Normal marrow signal is preserved.   Sinuses/Orbits: Paranasal sinus mucosal thickening. Orbits are unremarkable.   Other: Sella is unremarkable.  Mastoid air cells are clear.   IMPRESSION: No acute infarction, hemorrhage, or mass.   Mild chronic microvascular ischemic changes.     Electronically Signed   By: Geannie Keener M.D.   On: 12/28/2021 13:07      COGNITION: Overall cognitive status: Within functional limits for tasks assessed   SENSATION: WFL  COORDINATION: Slow to initiate at times  EDEMA:  None observed    POSTURE: rounded shoulders and forward head  LOWER EXTREMITY ROM:     Active  Right Eval Left Eval  Hip flexion    Hip extension    Hip abduction    Hip adduction    Hip internal rotation    Hip external rotation    Knee flexion    Knee extension    Ankle dorsiflexion    Ankle plantarflexion    Ankle inversion    Ankle eversion     (Blank rows = not tested)  LOWER EXTREMITY MMT:    MMT Right Eval Left Eval  Hip flexion 4 4  Hip extension 4 4  Hip abduction 4 4  Hip adduction    Hip internal rotation 4 4  Hip external rotation 4 4  Knee flexion 4 4  Knee extension 4 4  Ankle dorsiflexion 4 4  Ankle plantarflexion    Ankle inversion    Ankle eversion    (Blank rows = not tested)  BED MOBILITY:  NOT TESTED- Patient reports independent  TRANSFERS: Assistive device utilized: None  Sit to stand: Complete Independence Stand to sit: Complete Independence Chair to chair: Complete Independence Floor:  Not tested  GAIT: Gait  pattern: decreased arm swing- Right, decreased arm swing- Left, decreased step length- Right, decreased step length- Left, decreased stance time- Right, decreased stance time- Left, decreased stride length,  shuffling, festinating, poor foot clearance- Right, and poor foot clearance- Left Distance walked: > 100 feet Assistive device utilized:  has cane today but did not use- states only using in unfamiliar community settings Level of assistance: CGA   FUNCTIONAL TESTS:  5 times sit to stand: 18.81 sec without UE support Timed up and go (TUG): 16.03 sec avg without AD 6 minute walk test: 937 ftt 10 meter walk test: 0.79 m/s avg without AD Berg Balance Scale: 43/56  PATIENT SURVEYS:  ABC scale 75.6%                                                                                                                              TREATMENT DATE: 10/31/23    Octane reciprocal movement training x 8 min interval resistance, cues for consistent SPM through varied resistance to improve interval large amplitude movements.   Weighted stop/start gait training with 3# AW 468ft, 310ft, and 176ft with use of metronome  80BPM to 94BPM. Pt demonstrated improved step length with use of metronome. Able to stop/start throughout session without festination. Min cues for heel contact throughout session.   Forward step over bolster to reach for rail on wall x 12 bil; reciprocal pattern.  Lateral step over bolster to reach for rail wall x 12 bil  Sit<>stand witeh UE overhead reach with finger abduction 2x 10   Forward step up x 10 bil with UE support   Lateral step ups x 12 bil with light UE support   Throughout session, cues from PT for improved step height and heel contact CGA for safety with dynamic gait training.      PATIENT EDUCATION: Education details: large amplitude technique, HEP,  Person educated: Patient Education method: Medical illustrator, VC printout Education comprehension:  verbalized understanding, returned demonstration, and verbal cues required  HOME EXERCISE PROGRAM:  Access Code: [email or web address] URL: [email or web address] Date: 10/18/2023 Prepared by: Carlen Chasten  Exercises - Walking  - 1 x daily - 7 x weekly - 1 sets - 1 reps - 6-10 minutes hold - Sit to Stand Without Arm Support  - 1 x daily - 5-6 x weekly - 2-3 sets - 10 reps - Seated Finger Flicks with Elbow Extension  - 1 x daily - 7 x weekly - 2 sets - 20 reps - Seated Reaching to Side and Across Body  - 1 x daily - 7 x weekly - 2 sets - 10 reps  GOALS: Goals reviewed with patient? Yes  SHORT TERM GOALS: Target date: 11/14/2023  Pt will be independent with HEP in order to improve strength and balance in order to decrease fall risk and improve function at home and work. Baseline: Goal status: INITIAL   LONG TERM GOALS: Target date: 12/26/2023  1.  Patient (> 35 years old) will complete five times sit to stand test in < 15 seconds indicating an increased LE strength and improved balance. Baseline: EVAL= 18.81 sec without UE Goal  status: INITIAL  2.  Patient will improve ABC score to >80%   to demonstrate statistically significant improvement in mobility and quality of life as it relates to their confidence in his balance.  Baseline: EVAL=75.6 % Goal status: INITIAL   4.  Patient will increase Berg Balance score by > 6 points to demonstrate decreased fall risk during functional activities. Baseline: EVAL= 43/56 Goal status: INITIAL   5.   Patient will reduce timed up and go to <11 seconds to reduce fall risk and demonstrate improved transfer/gait ability. Baseline: EVAL= 16.03 without AD Goal status: INITIAL  6.   Patient will increase 10 meter walk test to >1.60m/s as to improve gait speed for better community ambulation and to reduce fall risk. Baseline: EVAL= 0.79 m/s avg without AD Goal status: INITIAL  7.   Patient will increase six minute walk test distance to >1000  for progression to community ambulator and improve gait ability Baseline: EVAL: To be tested next visit; 10/05/23: 937 ft  Goal status: INITIAL    ASSESSMENT:  CLINICAL IMPRESSION:  Patient presents with excellent motivation. PT treatment focused on stop/start gait with improved step height/ length for multiple bouts. Cues for improved step length with use of metronome. No festinations seen following initial stand from chair in waiting room. Tolerated all treatment well.  Mr. Gurnee will continue to benefit from skilled physical therapy intervention to address impairments, improve QOL, decrease fall risk, and attain therapy goals.    OBJECTIVE IMPAIRMENTS: Abnormal gait, decreased balance, decreased coordination, decreased endurance, decreased mobility, difficulty walking, decreased ROM, decreased strength, hypomobility, impaired flexibility, and postural dysfunction.   ACTIVITY LIMITATIONS: carrying, lifting, bending, standing, squatting, stairs, and transfers  PARTICIPATION LIMITATIONS: cleaning, shopping, community activity, and yard work  PERSONAL FACTORS: 1-2 comorbidities: HTN  are also affecting patient's functional outcome.   REHAB POTENTIAL: Good  CLINICAL DECISION MAKING: Stable/uncomplicated  EVALUATION COMPLEXITY: Low  PLAN:  PT FREQUENCY: 1-2x/week  PT DURATION: 12 weeks  PLANNED INTERVENTIONS: 97164- PT Re-evaluation, 97110-Therapeutic exercises, 97530- Therapeutic activity, [id number]- Neuromuscular re-education, 97535- Self Care, 16109- Manual therapy, [id number]- Gait training, [phone]- Orthotic Fit/training, [phone]- Canalith repositioning, [phone]- Electrical stimulation (manual), Patient/Family education, Balance training, Stair training, Taping, Dry Needling, Joint mobilization, Joint manipulation, Spinal manipulation, Spinal mobilization, Vestibular training, DME instructions, Cryotherapy, and Moist heat  PLAN FOR NEXT SESSION:   - continue dynamic gait training addressing  freezing when turning; using clock face reference.  - backwards gait training - dual-task challenges and/or motor sequencing challenges could use Blaze Pods - progress HEP as appropriate - Postural strengthening/trunk and LE ROM to assist with flexibility  Aurora Lees PT, DPT  Physical Therapist - [facility]  5:31 PM 10/31/23

## 2023-11-02 ENCOUNTER — Ambulatory Visit

## 2023-11-02 ENCOUNTER — Ambulatory Visit: Admitting: Physical Therapy

## 2023-11-02 DIAGNOSIS — R262 Difficulty in walking, not elsewhere classified: Secondary | ICD-10-CM

## 2023-11-02 DIAGNOSIS — R471 Dysarthria and anarthria: Secondary | ICD-10-CM

## 2023-11-02 DIAGNOSIS — G20A1 Parkinson's disease without dyskinesia, without mention of fluctuations: Secondary | ICD-10-CM | POA: Diagnosis not present

## 2023-11-02 DIAGNOSIS — R269 Unspecified abnormalities of gait and mobility: Secondary | ICD-10-CM

## 2023-11-02 NOTE — Therapy (Signed)
 OUTPATIENT PHYSICAL THERAPY NEURO TREATMENT/ Physical Therapy Progress Note   Dates of reporting period  10/03/23   to   11/02/23    Patient Name: Derrick Bishop MRN: [id number] DOB:1945/08/26, 78 y.o., male Today's Date: 11/02/2023   PCP: Dr. Firman Hughes REFERRING PROVIDER: Dr. Devora Folks  END OF SESSION:  PT End of Session - 11/02/23 1423     Visit Number 10    Number of Visits 24    Date for PT Re-Evaluation 12/26/23    Progress Note Due on Visit 10    PT Start Time 1445    PT Stop Time 1527    PT Time Calculation (min) 42 min    Equipment Utilized During Treatment Gait belt    Activity Tolerance Patient tolerated treatment well    Behavior During Therapy WFL for tasks assessed/performed                Past Medical History:  Diagnosis Date   Hypertension    No past surgical history on file. There are no active problems to display for this patient.   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 No updates  no falls since last PT visit   Pt accompanied by: self  PERTINENT HISTORY:  History obtained by Dr. Mason Sole on 09/15/2023, the below from that note: Assessment & Plan Parkinson's Disease in a patient with bilateral hand tremor (left > right) + slowness of movement + some changes in balance + hypophonia + micro graphia + hypomimia + minimal bradykinesia on the left + Mild cog wheeling on the  right and moderate cog wheeling on the left + decreased arm swing on the left + slight hunched forward posture - symptomatic, without new or worsening features   Parkinson's disease with worsening symptoms, including increased hand tremors, balance problems, and freezing episodes. Current treatment with carbidopa-levodopa three times daily shows no noticeable symptom improvement. Increasing the dose aims to reduce tremors, bradykinesia, and rigidity. The medication is safe with no long-term damage, and higher doses may be necessary as the disease progresses. Early and  adequate dosing can preserve functional years, and the highest tolerable dose should be administered early to maximize functional outcomes. - Increase carbidopa-levodopa to 25-100 mg two times a day  Increase carbidopa-levodopa to 1.5 pills three times daily for two weeks, then increase to 2 pills three times daily. - Order Parkinson's-specific Physical Therapy and Speech Therapy   PAIN:  Are you having pain? No  PRECAUTIONS: Fall  RED FLAGS: None   WEIGHT BEARING RESTRICTIONS: No  FALLS: Has patient fallen in last 6 months? No  LIVING ENVIRONMENT: Lives with: lives with their spouse Lives in: House/apartment Stairs: Yes: External: 3 steps; In process of having railings installed in garage Has following equipment at home: Single point cane  PLOF: Independent  PATIENT GOALS: improve my walking and confidence in getting around.   OBJECTIVE:  Note: Objective measures were completed at Evaluation unless otherwise noted.  DIAGNOSTIC FINDINGS:  Narrative & Impression  CLINICAL DATA:  Neuro deficit, acute, stroke suspected   EXAM: MRI HEAD WITHOUT CONTRAST   TECHNIQUE: Multiplanar, multiecho pulse sequences of the brain and surrounding structures were obtained without intravenous contrast.   COMPARISON:  2012   FINDINGS: Brain: There is no acute infarction or intracranial hemorrhage. There is no intracranial  mass, mass effect, or edema. There is no hydrocephalus or extra-axial fluid collection. Ventricles and sulci are within normal limits in size and configuration. Patchy T2 hyperintensity in the supratentorial white matter is nonspecific but may reflect mild chronic microvascular ischemic changes.   Vascular: Major vessel flow voids at the skull base are preserved.   Skull and upper cervical spine: Normal marrow signal is preserved.   Sinuses/Orbits: Paranasal sinus mucosal thickening. Orbits are unremarkable.   Other: Sella is unremarkable.  Mastoid air cells are clear.   IMPRESSION: No acute infarction, hemorrhage, or mass.   Mild chronic microvascular ischemic changes.     Electronically Signed   By: Geannie Keener M.D.   On: 12/28/2021 13:07      COGNITION: Overall cognitive status: Within functional limits for tasks assessed   SENSATION: WFL  COORDINATION: Slow to initiate at times  EDEMA:  None observed    POSTURE: rounded shoulders and forward head  LOWER EXTREMITY ROM:     Active  Right Eval Left Eval  Hip flexion    Hip extension    Hip abduction    Hip adduction    Hip internal rotation    Hip external rotation    Knee flexion    Knee extension    Ankle dorsiflexion    Ankle plantarflexion    Ankle inversion    Ankle eversion     (Blank rows = not tested)  LOWER EXTREMITY MMT:    MMT Right Eval Left Eval  Hip flexion 4 4  Hip extension 4 4  Hip abduction 4 4  Hip adduction    Hip internal rotation 4 4  Hip external rotation 4 4  Knee flexion 4 4  Knee extension 4 4  Ankle dorsiflexion 4 4  Ankle plantarflexion    Ankle inversion    Ankle eversion    (Blank rows = not tested)  BED MOBILITY:  NOT TESTED- Patient reports independent  TRANSFERS: Assistive device utilized: None  Sit to stand: Complete Independence Stand to sit: Complete Independence Chair to chair: Complete Independence Floor:  Not tested  GAIT: Gait  pattern: decreased arm swing- Right, decreased arm swing- Left, decreased step length- Right, decreased step length- Left, decreased stance time- Right, decreased  stance time- Left, decreased stride length, shuffling, festinating, poor foot clearance- Right, and poor foot clearance- Left Distance walked: > 100 feet Assistive device utilized:  has cane today but did not use- states only using in unfamiliar community settings Level of assistance: CGA   FUNCTIONAL TESTS:  5 times sit to stand: 18.81 sec without UE support Timed up and go (TUG): 16.03 sec avg without AD 6 minute walk test: 937 ftt 10 meter walk test: 0.79 m/s avg without AD Berg Balance Scale: 43/56  PATIENT SURVEYS:  ABC scale 75.6%                                                                                                                              TREATMENT DATE: 11/02/23   Physical therapy treatment session today consisted of completing assessment of goals and administration of testing as demonstrated and documented in flow sheet, treatment, and goals section of this note. Addition treatments may be found below.   6 Min Walk Test:  Instructed patient to ambulate as quickly and as safely as possible for 6 minutes using LRAD. Patient was allowed to take standing rest breaks without stopping the test, but if the patient required a sitting rest break the clock would be stopped and the test would be over.  Results: 1020 feet without AD. Results indicate that the patient has reduced endurance with ambulation compared to age matched norms.  Age Matched Norms (in meters): 62-69 yo M: 53 F: 55, 83-79 yo M: 70 F: 471, 64-89 yo M: 417 F: 392 MDC: 58.21 meters (190.98 feet) or 50 meters (ANPTA Core Set of Outcome Measures for Adults with Neurologic Conditions, 2018)  Five times Sit to Stand Test (FTSS)  TIME: 9.26 sec  Cut off scores indicative of increased fall risk: >12 sec CVA, >16 sec PD, >13 sec vestibular (ANPTA  Core Set of Outcome Measures for Adults with Neurologic Conditions, 2018)  PT instructed pt in TUG: 12.43 sec ( >13.5 sec indicates increased fall risk)  10 Meter Walk Test: Patient instructed to walk 10 meters (32.8 ft) as quickly and as safely as possible at their normal speed Results: 1.02 m/s no AD   Cut off scores:   Household Ambulator  < 0.4 m/s  Limited Community Ambulator  0.4 - 0.8 m/s  [employer]  > 0.8 m/s  Increased fall risk  < 1.89m/s  Crossing a Street  >1.44m/s  MCID 0.05 m/s (small), 0.13 m/s (moderate), 0.06 m/s (significant)  (ANPTA Core Set of Outcome Measures for Adults with Neurologic Conditions, 2018)    Patient demonstrates increased fall risk as noted by score of  47 /56 on Berg Balance Scale.  (<36= high risk for falls, close to 100%; 37-45 significant >80%; 46-51 moderate >50%; 52-55 lower >25%)   [facility] PT Assessment - 11/02/23 0001       Berg Balance Test   Sit to Stand Able to stand without using hands  and stabilize independently    Standing Unsupported Able to stand safely 2 minutes    Sitting with Back Unsupported but Feet Supported on Floor or Stool Able to sit safely and securely 2 minutes    Stand to Sit Sits safely with minimal use of hands    Transfers Able to transfer safely, minor use of hands    Standing Unsupported with Eyes Closed Able to stand 10 seconds safely    Standing Unsupported with Feet Together Able to place feet together independently and stand 1 minute safely    From Standing, Reach Forward with Outstretched Arm Can reach confidently >25 cm (10")    From Standing Position, Pick up Object from Floor Able to pick up shoe, needs supervision    From Standing Position, Turn to Look Behind Over each Shoulder Turn sideways only but maintains balance    Turn 360 Degrees Able to turn 360 degrees safely but slowly    Standing Unsupported, Alternately Place Feet on Step/Stool Able to stand independently and complete 8 steps >20  seconds    Standing Unsupported, One Foot in Front Able to place foot tandem independently and hold 30 seconds    Standing on One Leg Tries to lift leg/unable to hold 3 seconds but remains standing independently    Total Score 47                 PATIENT EDUCATION: Education details: large amplitude technique, HEP,  Person educated: Patient Education method: Medical illustrator, VC printout Education comprehension: verbalized understanding, returned demonstration, and verbal cues required  HOME EXERCISE PROGRAM:  Access Code: [email or web address] URL: [email or web address] Date: 10/18/2023 Prepared by: Carlen Chasten  Exercises - Walking  - 1 x daily - 7 x weekly - 1 sets - 1 reps - 6-10 minutes hold - Sit to Stand Without Arm Support  - 1 x daily - 5-6 x weekly - 2-3 sets - 10 reps - Seated Finger Flicks with Elbow Extension  - 1 x daily - 7 x weekly - 2 sets - 20 reps - Seated Reaching to Side and Across Body  - 1 x daily - 7 x weekly - 2 sets - 10 reps  GOALS: Goals reviewed with patient? Yes  SHORT TERM GOALS: Target date: 11/14/2023  Pt will be independent with HEP in order to improve strength and balance in order to decrease fall risk and improve function at home and work. Baseline: 5/7: Every other day, tries to incorporate into everyday tasks  Goal status: ONGOING   LONG TERM GOALS: Target date: 12/26/2023  1.  Patient (> 90 years old) will complete five times sit to stand test in < 15 seconds indicating an increased LE strength and improved balance. Baseline: EVAL= 18.81 sec without UE 11/02/23: 9.26 sec  Goal status: ONGOING  2.  Patient will improve ABC score to >80%   to demonstrate statistically significant improvement in mobility and quality of life as it relates to their confidence in his balance.  Baseline: EVAL=75.6 % 5/7: 85% Goal status: INITIAL   4.  Patient will increase Berg Balance score by > 6 points to demonstrate decreased fall risk  during functional activities. Baseline: EVAL= 43/56 5/7: 47 Goal status: ONGOING   5.   Patient will reduce timed up and go to <11 seconds to reduce fall risk and demonstrate improved transfer/gait ability. Baseline: EVAL= 16.03 without AD 5/7:12.43 sec Goal status: MET   6.   Patient will increase 10 meter walk test  to >1.34m/s as to improve gait speed for better community ambulation and to reduce fall risk. Baseline: EVAL= 0.79 m/s avg without AD 5/7:1.02 m/s Goal status: MET  7.   Patient will increase six minute walk test distance to >1000 for progression to community ambulator and improve gait ability Baseline: EVAL: To be tested next visit; 10/05/23: 937 ft 5/7:1020 ft Goal status: MET    ASSESSMENT:  CLINICAL IMPRESSION:   Patient presents with excellent motivation. Pt completes progress note assessments this date. Pt shows progress or meets all of his long term goals. Pt needs to be more consistent and deliberate with HEP but is completing some. Pt fall risk and balance show great improvement as well as his capacity for community based ambulation. Patient's condition has the potential to improve in response to therapy. Maximum improvement is yet to be obtained. The anticipated improvement is attainable and reasonable in a generally predictable time. Pt will continue to benefit from skilled physical therapy intervention to address impairments, improve QOL, and attain therapy goals.     OBJECTIVE IMPAIRMENTS: Abnormal gait, decreased balance, decreased coordination, decreased endurance, decreased mobility, difficulty walking, decreased ROM, decreased strength, hypomobility, impaired flexibility, and postural dysfunction.   ACTIVITY LIMITATIONS: carrying, lifting, bending, standing, squatting, stairs, and transfers  PARTICIPATION LIMITATIONS: cleaning, shopping, community activity, and yard work  PERSONAL FACTORS: 1-2 comorbidities: HTN  are also affecting patient's functional  outcome.   REHAB POTENTIAL: Good  CLINICAL DECISION MAKING: Stable/uncomplicated  EVALUATION COMPLEXITY: Low  PLAN:  PT FREQUENCY: 1-2x/week  PT DURATION: 12 weeks  PLANNED INTERVENTIONS: 97164- PT Re-evaluation, 97110-Therapeutic exercises, 97530- Therapeutic activity, [id number]- Neuromuscular re-education, 97535- Self Care, 16109- Manual therapy, [id number]- Gait training, [phone]- Orthotic Fit/training, [phone]- Canalith repositioning, [phone]- Electrical stimulation (manual), Patient/Family education, Balance training, Stair training, Taping, Dry Needling, Joint mobilization, Joint manipulation, Spinal manipulation, Spinal mobilization, Vestibular training, DME instructions, Cryotherapy, and Moist heat  PLAN FOR NEXT SESSION:   - continue dynamic gait training addressing freezing when turning; using clock face reference.  - backwards gait training - dual-task challenges and/or motor sequencing challenges could use Blaze Pods - progress HEP as appropriate - Postural strengthening/trunk and LE ROM to assist with flexibility  Edwina Gram PT ,DPT Physical Therapist- [facility]  [facility]   2:24 PM 11/02/23

## 2023-11-02 NOTE — Therapy (Signed)
 OUTPATIENT SPEECH LANGUAGE PATHOLOGY PARKINSON'S TREATMENT / PROGRESS NOTE  Speech Therapy Progress Note  Dates of Reporting Period: 10/03/23 to 11/02/23  Objective: Patient has been seen for 10 speech therapy sessions this reporting period targeting dysarthria in setting of Parkinson's Disease. Patient is making progress toward LTGs and met 3/3 STGs this reporting period. See skilled intervention, clinical impressions, and goals below for details.   Patient Name: Derrick Bishop MRN: [id number] DOB:[birth year], 78 y.o., male Today's Date: 11/02/2023  PCP: Firman Hughes, MD REFERRING PROVIDER: Devora Folks, MD    End of Session - 11/02/23 1355     Visit Number 10    Number of Visits 24    Date for SLP Re-Evaluation 12/26/23    SLP Start Time 1400    SLP Stop Time  1445    SLP Time Calculation (min) 45 min    Activity Tolerance Patient tolerated treatment well             Past Medical History:  Diagnosis Date   Hypertension    No past surgical history on file. There are no active problems to display for this patient.   ONSET DATE:  ~3 years ago;   REFERRING DIAG: Parkinson's Disease without dyskinesia, without mention of fluctuations  THERAPY DIAG:  Dysarthria and anarthria  Rationale for Evaluation and Treatment Rehabilitation  SUBJECTIVE:   SUBJECTIVE STATEMENT: Pt alert, pleasant, and cooperative. Pt accompanied by: self  PERTINENT HISTORY: Per Neuro note, 09/15/23, "Parkinson's Disease in a patient with bilateral hand tremor (left > right) + slowness of movement + some changes in balance + hypophonia + micro graphia + hypomimia + minimal bradykinesia on the left + Mild cog wheeling on the right and moderate cog wheeling on the left + decreased arm swing on the left + slight hunched forward posture - symptomatic, without new or worsening features." Pt with hx HTN, HLD, Vit B12 and Vit deficiencies. MRI brain, 12/28/21, "no a cute infarction, hemorrhage, or mass. Mild  chronic microvascular ischemic changes."    PAIN:  Are you having pain? No   FALLS: Has patient fallen in last 6 months? Defer to PT  LIVING ENVIRONMENT: Lives with: lives with their spouse Lives in: House/apartment  PLOF:  ambulates with single point cane; driving; indep  PATIENT GOALS  for speech to improve  OBJECTIVE:  TODAY'S TREATMENT:  Reviewed EMST including purpose, how to complete ex's, and maintenance. Pt completed 5 sets of 5 reps with indep use of hand placement to facilitate adequate lip seal given Bell's Palsy.   Voice ex's completed as follows: Sustained /a/: x10, 85-86 dB Ascending pitch glides x10, 84-85dB Descending pitch glides: x10, 85-86 db  Pt read x8 functional phrases twice through with loud and intentional voice, averaging 85dB.   During 5 minute speech sample, pt averaged 77dB with min cues for vocal intensity.  Pt benefited from use of biofeeback using Voice Analyst app.   Pt educated re: changes to speech/voice due to Parkinson's Disease, voice ex's, EMST, SLP POC, and HEP.  PATIENT EDUCATION: Education details: as above Person educated: Patient  Education method: Programmer, multimedia; Handout Education comprehension: verbalized understanding and needs further education   HOME EXERCISE PROGRAM: Voice Ex's and Functional Reading as outlined above EMST  GOALS: Goals reviewed with patient? Yes  SHORT TERM GOALS: Target date: 10 sessions  Pt will participate in further assessment of functional cognitive-linguistic ability with additional goals to be set as appropriate. Baseline: Goal status: MET  2.  The patient will  complete HEP (Maximum duration "ah", High/Lows, and Functional Phrases) at average loudness >/= 80 dB and with loud, good quality voice with min cues.  Baseline: Goal status: MET  3.  Pt will sustain conversational loudness (75-85 dB) for 3-5 minutes of conversation with min cues. Baseline:  Goal status: MET    LONG TERM  GOALS: Target date: 12 weeks  The patient will complete HEP (Maximum duration "ah", High/Lows, and Functional Phrases) at average loudness >/= 80 dB and with loud, good quality voice indep. Baseline: Goal status: PROGRESSING  Pt will sustain conversational loudness (75-85 dB) for 8-10 minutes of conversation with min cues. Baseline:  Goal status: PROGRESSING   ASSESSMENT:  CLINICAL IMPRESSION: Patient is a 78 y.o. male who was seen today for evaluation in setting of Parkinson's Disease. er Neuro note, 09/15/23, "Parkinson's Disease in a patient with bilateral hand tremor (left > right) + slowness of movement + some changes in balance + hypophonia + micro graphia + hypomimia + minimal bradykinesia on the left + Mild cog wheeling on the right and moderate cog wheeling on the left + decreased arm swing on the left + slight hunched forward posture - symptomatic, without new or worsening features." Pt taking carbidopa-levodopa for Parkinson's Disease with recent increased in dosage.  Pt with hx HTN, HLD, Vit B12 and Vit deficiencies. MRI brain, 12/28/21, "no a cute infarction, hemorrhage, or mass. Mild chronic microvascular ischemic changes." Pt presents with a mild hypokinetic dysarthric c/b hypophonia, mildly hoarse vocal quality and intermittent fast rushes of speech. Evidence of volume decay noted. Wife endorsed that speech is worse in home with pt often "muttering." Wife also identified x2 persons who have stopped communicating with pt due to difficulty hearing him. Pt stimulable to verbal cues and would benefit from course of ST to improve pt's ability to communicate at home and in the community.  OBJECTIVE IMPAIRMENTS include dysarthria. These impairments are limiting patient from effectively communicating at home and in community. Factors affecting potential to achieve goals and functional outcome are n/a. Patient will benefit from skilled SLP services to address above impairments and improve  overall function.  REHAB POTENTIAL: Good  PLAN: SLP FREQUENCY: 1-2x/week  SLP DURATION: 12 weeks  PLANNED INTERVENTIONS: Functional tasks, Multimodal communication approach, SLP instruction and feedback, Compensatory strategies, Patient/family education, and further cognitive-linguistic assessment.   Dia Forget, M.S., CCC-SLP Speech-Language Pathologist [place] [facility] [phone] [place])   [facility] at [facility] [address] [place], [place], [address] Phone: [phone]   Fax:  [phone]

## 2023-11-07 ENCOUNTER — Ambulatory Visit: Admitting: Physical Therapy

## 2023-11-07 ENCOUNTER — Ambulatory Visit

## 2023-11-07 DIAGNOSIS — R2681 Unsteadiness on feet: Secondary | ICD-10-CM

## 2023-11-07 DIAGNOSIS — G20A1 Parkinson's disease without dyskinesia, without mention of fluctuations: Secondary | ICD-10-CM

## 2023-11-07 DIAGNOSIS — R471 Dysarthria and anarthria: Secondary | ICD-10-CM

## 2023-11-07 DIAGNOSIS — M6281 Muscle weakness (generalized): Secondary | ICD-10-CM

## 2023-11-07 DIAGNOSIS — R269 Unspecified abnormalities of gait and mobility: Secondary | ICD-10-CM

## 2023-11-07 DIAGNOSIS — R262 Difficulty in walking, not elsewhere classified: Secondary | ICD-10-CM

## 2023-11-07 DIAGNOSIS — R278 Other lack of coordination: Secondary | ICD-10-CM

## 2023-11-07 NOTE — Therapy (Signed)
 OUTPATIENT PHYSICAL THERAPY NEURO TREATMENT/ Physical Therapy Progress Note   Dates of reporting period  10/03/23   to   11/02/23    Patient Name: Derrick Bishop MRN: [id number] DOB:June 25, 1946, 78 y.o., male Today's Date: 11/07/2023   PCP: Dr. Firman Hughes REFERRING PROVIDER: Dr. Devora Folks  END OF SESSION:  PT End of Session - 11/07/23 0907     Visit Number 11    Number of Visits 24    Date for PT Re-Evaluation 12/26/23    Progress Note Due on Visit 10    PT Start Time 0930    PT Stop Time 1015    PT Time Calculation (min) 45 min    Equipment Utilized During Treatment Gait belt    Activity Tolerance Patient tolerated treatment well    Behavior During Therapy WFL for tasks assessed/performed                Past Medical History:  Diagnosis Date   Hypertension    No past surgical history on file. There are no active problems to display for this patient.   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 No updates  no falls since last PT visit   Pt accompanied by: self  PERTINENT HISTORY:  History obtained by Dr. Mason Sole on 09/15/2023, the below from that note: Assessment & Plan Parkinson's Disease in a patient with bilateral hand tremor (left > right) + slowness of movement + some changes in balance + hypophonia +  micro graphia + hypomimia + minimal bradykinesia on the left + Mild cog wheeling on the right and moderate cog wheeling on the left + decreased arm swing on the left + slight hunched forward posture - symptomatic, without new or worsening features   Parkinson's disease with worsening symptoms, including increased hand tremors, balance problems, and freezing episodes. Current treatment with carbidopa-levodopa three times daily shows no noticeable symptom improvement. Increasing the dose aims to reduce tremors, bradykinesia, and rigidity. The medication is safe with no long-term  damage, and higher doses may be necessary as the disease progresses. Early and adequate dosing can preserve functional years, and the highest tolerable dose should be administered early to maximize functional outcomes. - Increase carbidopa-levodopa to 25-100 mg two times a day  Increase carbidopa-levodopa to 1.5 pills three times daily for two weeks, then increase to 2 pills three times daily. - Order Parkinson's-specific Physical Therapy and Speech Therapy   PAIN:  Are you having pain? No  PRECAUTIONS: Fall  RED FLAGS: None   WEIGHT BEARING RESTRICTIONS: No  FALLS: Has patient fallen in last 6 months? No  LIVING ENVIRONMENT: Lives with: lives with their spouse Lives in: House/apartment Stairs: Yes: External: 3 steps; In process of having railings installed in garage Has following equipment at home: Single point cane  PLOF: Independent  PATIENT GOALS: improve my walking and confidence in getting around.   OBJECTIVE:  Note: Objective measures were completed at Evaluation unless otherwise noted.  DIAGNOSTIC FINDINGS:  Narrative & Impression  CLINICAL DATA:  Neuro deficit, acute, stroke suspected   EXAM: MRI HEAD WITHOUT CONTRAST   TECHNIQUE: Multiplanar, multiecho pulse sequences of the brain and surrounding structures were obtained without intravenous contrast.   COMPARISON:  2012   FINDINGS: Brain:  There is no acute infarction or intracranial hemorrhage. There is no intracranial mass, mass effect, or edema. There is no hydrocephalus or extra-axial fluid collection. Ventricles and sulci are within normal limits in size and configuration. Patchy T2 hyperintensity in the supratentorial white matter is nonspecific but may reflect mild chronic microvascular ischemic changes.   Vascular: Major vessel flow voids at the skull base are preserved.   Skull and upper cervical spine: Normal marrow signal is preserved.   Sinuses/Orbits: Paranasal sinus mucosal thickening. Orbits are unremarkable.   Other: Sella is unremarkable.  Mastoid air cells are clear.   IMPRESSION: No acute infarction, hemorrhage, or mass.   Mild chronic microvascular ischemic changes.     Electronically Signed   By: Geannie Keener M.D.   On: 12/28/2021 13:07      COGNITION: Overall cognitive status: Within functional limits for tasks assessed   SENSATION: WFL  COORDINATION: Slow to initiate at times  EDEMA:  None observed    POSTURE: rounded shoulders and forward head  LOWER EXTREMITY ROM:     Active  Right Eval Left Eval  Hip flexion    Hip extension    Hip abduction    Hip adduction    Hip internal rotation    Hip external rotation    Knee flexion    Knee extension    Ankle dorsiflexion    Ankle plantarflexion    Ankle inversion    Ankle eversion     (Blank rows = not tested)  LOWER EXTREMITY MMT:    MMT Right Eval Left Eval  Hip flexion 4 4  Hip extension 4 4  Hip abduction 4 4  Hip adduction    Hip internal rotation 4 4  Hip external rotation 4 4  Knee flexion 4 4  Knee extension 4 4  Ankle dorsiflexion 4 4  Ankle plantarflexion    Ankle inversion    Ankle eversion    (Blank rows = not tested)  BED MOBILITY:  NOT TESTED- Patient reports independent  TRANSFERS: Assistive device utilized: None  Sit to stand: Complete Independence Stand to sit: Complete  Independence Chair to chair: Complete Independence Floor: Not tested  GAIT: Gait pattern: decreased arm swing- Right, decreased arm swing- Left, decreased  step length- Right, decreased step length- Left, decreased stance time- Right, decreased stance time- Left, decreased stride length, shuffling, festinating, poor foot clearance- Right, and poor foot clearance- Left Distance walked: > 100 feet Assistive device utilized: has cane today but did not use- states only using in unfamiliar community settings Level of assistance: CGA   FUNCTIONAL TESTS:  5 times sit to stand: 18.81 sec without UE support Timed up and go (TUG): 16.03 sec avg without AD 6 minute walk test: 937 ftt 10 meter walk test: 0.79 m/s avg without AD Berg Balance Scale: 43/56  PATIENT SURVEYS:  ABC scale 75.6%                                                                                                                              TREATMENT DATE: 11/07/23     Octane:  Level 2, leg press mode x 6 min; 9 min total  Cues for improved high amplitude movement when leg press mode engages.   Weighted gait training x 465ft. Cues for step length, decreased cadence, and improved heel contact. With intermittent carryover, required additional instruction following turns and through door.   Weighted gait over 4 canes in parallel bars. With 4# AW x 5 laps. Additionally weighted gait over 2 canes and 2 bolsters x 5 laps. 4#AW  Side stepping over  canes and bolsters x 5 laps with 1 lap with UE support and 4 without  Forward/reverse stepping over single cane in floor x10 with UE support x 10 without UE support.   Throughout gait in parallel bars, cues for decreased steps in 180deg turn with clock visual. Cues for stepping towards far leg of chair when turning to sit to reduce festination.  Throughout session PT provided CGA for safety and gait belt in place for increased safety.        PATIENT EDUCATION: Education details:  large amplitude technique, HEP,  Person educated: Patient Education method: Medical illustrator, VC printout Education comprehension: verbalized understanding, returned demonstration, and verbal cues required  HOME EXERCISE PROGRAM:  Access Code: [email or web address] URL: [email or web address] Date: 10/18/2023 Prepared by: Carlen Chasten  Exercises - Walking  - 1 x daily - 7 x weekly - 1 sets - 1 reps - 6-10 minutes hold - Sit to Stand Without Arm Support  - 1 x daily - 5-6 x weekly - 2-3 sets - 10 reps - Seated Finger Flicks with Elbow Extension  - 1 x daily - 7 x weekly - 2 sets - 20 reps - Seated Reaching to Side and Across Body  - 1 x daily - 7 x weekly - 2 sets - 10 reps  GOALS: Goals reviewed with patient? Yes  SHORT TERM GOALS: Target date: 11/14/2023  Pt will be independent with HEP in order to improve strength and balance in order to decrease fall risk and improve function at home and work. Baseline: 5/7: Every other day, tries to incorporate into everyday tasks  Goal status: ONGOING   LONG TERM GOALS: Target date: 12/26/2023  1.  Patient (> 46 years old) will complete five times sit to stand test in < 15 seconds indicating an increased LE strength and improved balance. Baseline: EVAL= 18.81 sec without UE 11/02/23: 9.26 sec  Goal status: ONGOING  2.  Patient will improve ABC score to >80%   to demonstrate statistically significant improvement in mobility and quality of life as it relates to their confidence in his balance.  Baseline: EVAL=75.6 % 5/7: 85% Goal status: INITIAL   4.  Patient will increase Berg Balance score by > 6 points to demonstrate decreased fall risk during functional activities. Baseline: EVAL= 43/56 5/7: 47 Goal status: ONGOING   5.   Patient will reduce timed up and go to <11 seconds to reduce fall risk and demonstrate improved transfer/gait ability. Baseline: EVAL= 16.03 without AD 5/7:12.43 sec Goal status: MET   6.   Patient  will increase 10 meter walk test to >1.39m/s as to improve gait speed for better community ambulation and to reduce fall risk. Baseline: EVAL= 0.79 m/s avg without AD 5/7:1.02 m/s Goal status: MET  7.   Patient will increase six minute walk test distance to >1000 for progression to community ambulator and improve gait ability Baseline: EVAL: To be tested next visit; 10/05/23: 937 ft 5/7:1020 ft Goal status: MET    ASSESSMENT:  CLINICAL IMPRESSION:   Patient presents with excellent motivation. PT treatment focused on increased step length and large amplitude movement with weighted gait training and visual feedback to increase step length in all planes. Tolerated well, but required cues for improved heel contact and decreased cadence following turns and through doors.  Pt will continue to benefit from skilled physical therapy intervention to address impairments, improve QOL, and attain therapy goals.     OBJECTIVE IMPAIRMENTS: Abnormal gait, decreased balance, decreased coordination, decreased endurance, decreased mobility, difficulty walking, decreased ROM, decreased strength, hypomobility, impaired flexibility, and postural dysfunction.   ACTIVITY LIMITATIONS: carrying, lifting, bending, standing, squatting, stairs, and transfers  PARTICIPATION LIMITATIONS: cleaning, shopping, community activity, and yard work  PERSONAL FACTORS: 1-2 comorbidities: HTN are also affecting patient's functional outcome.   REHAB POTENTIAL: Good  CLINICAL DECISION MAKING: Stable/uncomplicated  EVALUATION COMPLEXITY: Low  PLAN:  PT FREQUENCY: 1-2x/week  PT DURATION: 12 weeks  PLANNED INTERVENTIONS: 97164- PT Re-evaluation, 97110-Therapeutic exercises, 97530- Therapeutic activity, [id number]- Neuromuscular re-education, 97535- Self Care, 81191- Manual therapy, [id number]- Gait training, [phone]- Orthotic Fit/training, [phone]- Canalith repositioning, [phone]- Electrical stimulation (manual), Patient/Family education,  Balance training, Stair training, Taping, Dry Needling, Joint mobilization, Joint manipulation, Spinal manipulation, Spinal mobilization, Vestibular training, DME instructions, Cryotherapy, and Moist heat  PLAN FOR NEXT SESSION:   - continue dynamic gait training addressing freezing when turning; using clock face reference.  - backwards gait training - dual-task challenges and/or motor sequencing challenges could use Blaze Pods - progress HEP as appropriate - Postural strengthening/trunk and LE ROM to assist with flexibility  Barbara Book PT ,DPT Physical Therapist-   [facility]   9:35 AM 11/07/23

## 2023-11-07 NOTE — Therapy (Signed)
 OUTPATIENT SPEECH LANGUAGE PATHOLOGY PARKINSON'S TREATMENT   Patient Name: Derrick Bishop MRN: [id number] DOB:[birth year], 78 y.o., male Today's Date: 11/07/2023  PCP: Firman Hughes, MD REFERRING PROVIDER: Devora Folks, MD    End of Session - 11/07/23 1003     Visit Number 11    Number of Visits 24    Date for SLP Re-Evaluation 12/26/23    SLP Start Time 1015    SLP Stop Time  1100    SLP Time Calculation (min) 45 min    Activity Tolerance Patient tolerated treatment well             Past Medical History:  Diagnosis Date   Hypertension    No past surgical history on file. There are no active problems to display for this patient.   ONSET DATE:  ~3 years ago;   REFERRING DIAG: Parkinson's Disease without dyskinesia, without mention of fluctuations  THERAPY DIAG:  Dysarthria and anarthria  Rationale for Evaluation and Treatment Rehabilitation  SUBJECTIVE:   SUBJECTIVE STATEMENT: Pt alert, pleasant, and cooperative. Pt accompanied by: self  PERTINENT HISTORY: Per Neuro note, 09/15/23, "Parkinson's Disease in a patient with bilateral hand tremor (left > right) + slowness of movement + some changes in balance + hypophonia + micro graphia + hypomimia + minimal bradykinesia on the left + Mild cog wheeling on the right and moderate cog wheeling on the left + decreased arm swing on the left + slight hunched forward posture - symptomatic, without new or worsening features." Pt with hx HTN, HLD, Vit B12 and Vit deficiencies. MRI brain, 12/28/21, "no a cute infarction, hemorrhage, or mass. Mild chronic microvascular ischemic changes."    PAIN:  Are you having pain? No   FALLS: Has patient fallen in last 6 months? Defer to PT  LIVING ENVIRONMENT: Lives with: lives with their spouse Lives in: House/apartment  PLOF:  ambulates with single point cane; driving; indep  PATIENT GOALS  for speech to improve  OBJECTIVE:  TODAY'S TREATMENT:  Reviewed EMST including purpose,  how to complete ex's, and maintenance. Attempted 1/4 turn increase in threshold, but only able to complete 2/5 sets at increased threshold. Pt completed the remaining 3/5 sets of 5 reps at original threshold with indep use of hand placement to facilitate adequate lip seal given Bell's Palsy.   Voice ex's completed as follows: Sustained /a/: x10, 85-86 dB Ascending pitch glides x10, 84-85dB Descending pitch glides: x10, 85-86 db  Pt read x8 functional phrases twice through with loud and intentional voice, averaging 85dB.   During 5 minute speech sample, pt averaged 74dB with min cues for vocal intensity. Suspect fatigue from PT playing a role.  Pt benefited from use of biofeeback using Voice Analyst app.   Pt educated re: changes to speech/voice due to Parkinson's Disease, voice ex's, EMST, SLP POC, and HEP.  PATIENT EDUCATION: Education details: as above Person educated: Patient  Education method: Programmer, multimedia; Handout Education comprehension: verbalized understanding and needs further education   HOME EXERCISE PROGRAM: Voice Ex's and Functional Reading as outlined above EMST  GOALS: Goals reviewed with patient? Yes  SHORT TERM GOALS: Target date: 10 sessions  Pt will participate in further assessment of functional cognitive-linguistic ability with additional goals to be set as appropriate. Baseline: Goal status: MET  2.  The patient will complete HEP (Maximum duration "ah", High/Lows, and Functional Phrases) at average loudness >/= 80 dB and with loud, good quality voice with min cues.  Baseline: Goal status: MET  3.  Pt will sustain conversational loudness (75-85 dB) for 3-5 minutes of conversation with min cues. Baseline:  Goal status: MET    LONG TERM GOALS: Target date: 12 weeks  The patient will complete HEP (Maximum duration "ah", High/Lows, and Functional Phrases) at average loudness >/= 80 dB and with loud, good quality voice indep. Baseline: Goal status:  PROGRESSING  Pt will sustain conversational loudness (75-85 dB) for 8-10 minutes of conversation with min cues. Baseline:  Goal status: PROGRESSING   ASSESSMENT:  CLINICAL IMPRESSION: Patient is a 78 y.o. male who was seen today for evaluation in setting of Parkinson's Disease. er Neuro note, 09/15/23, "Parkinson's Disease in a patient with bilateral hand tremor (left > right) + slowness of movement + some changes in balance + hypophonia + micro graphia + hypomimia + minimal bradykinesia on the left + Mild cog wheeling on the right and moderate cog wheeling on the left + decreased arm swing on the left + slight hunched forward posture - symptomatic, without new or worsening features." Pt taking carbidopa-levodopa for Parkinson's Disease with recent increased in dosage.  Pt with hx HTN, HLD, Vit B12 and Vit deficiencies. MRI brain, 12/28/21, "no a cute infarction, hemorrhage, or mass. Mild chronic microvascular ischemic changes." Pt presents with a mild hypokinetic dysarthric c/b hypophonia, mildly hoarse vocal quality and intermittent fast rushes of speech. Evidence of volume decay noted. Wife endorsed that speech is worse in home with pt often "muttering." Wife also identified x2 persons who have stopped communicating with pt due to difficulty hearing him. Pt stimulable to verbal cues and would benefit from course of ST to improve pt's ability to communicate at home and in the community.  OBJECTIVE IMPAIRMENTS include dysarthria. These impairments are limiting patient from effectively communicating at home and in community. Factors affecting potential to achieve goals and functional outcome are n/a. Patient will benefit from skilled SLP services to address above impairments and improve overall function.  REHAB POTENTIAL: Good  PLAN: SLP FREQUENCY: 1-2x/week  SLP DURATION: 12 weeks  PLANNED INTERVENTIONS: Functional tasks, Multimodal communication approach, SLP instruction and feedback,  Compensatory strategies, Patient/family education, and further cognitive-linguistic assessment.   Dia Forget, M.S., CCC-SLP Speech-Language Pathologist [facility] [phone] [place])   [place] [facility] at [facility] [address] [place], [place], [address] Phone: [phone]   Fax:  [phone]

## 2023-11-09 ENCOUNTER — Ambulatory Visit

## 2023-11-09 DIAGNOSIS — R269 Unspecified abnormalities of gait and mobility: Secondary | ICD-10-CM

## 2023-11-09 DIAGNOSIS — G20A1 Parkinson's disease without dyskinesia, without mention of fluctuations: Secondary | ICD-10-CM | POA: Diagnosis not present

## 2023-11-09 DIAGNOSIS — R471 Dysarthria and anarthria: Secondary | ICD-10-CM

## 2023-11-09 DIAGNOSIS — R262 Difficulty in walking, not elsewhere classified: Secondary | ICD-10-CM

## 2023-11-09 DIAGNOSIS — M6281 Muscle weakness (generalized): Secondary | ICD-10-CM

## 2023-11-09 DIAGNOSIS — R2681 Unsteadiness on feet: Secondary | ICD-10-CM

## 2023-11-09 DIAGNOSIS — R278 Other lack of coordination: Secondary | ICD-10-CM

## 2023-11-09 NOTE — Therapy (Signed)
 OUTPATIENT SPEECH LANGUAGE PATHOLOGY PARKINSON'S TREATMENT   Patient Name: Derrick Bishop MRN: [id number] DOB:11/22/1945, 78 y.o., male Today's Date: 11/09/2023  PCP: Firman Hughes, MD REFERRING PROVIDER: Devora Folks, MD    End of Session - 11/09/23 1352     Visit Number 12    Number of Visits 24    Date for SLP Re-Evaluation 12/26/23    SLP Start Time 1445    SLP Stop Time  1530    SLP Time Calculation (min) 45 min    Activity Tolerance Patient tolerated treatment well             Past Medical History:  Diagnosis Date   Hypertension    No past surgical history on file. There are no active problems to display for this patient.   ONSET DATE:  ~3 years ago;   REFERRING DIAG: Parkinson's Disease without dyskinesia, without mention of fluctuations  THERAPY DIAG:  Dysarthria and anarthria  Rationale for Evaluation and Treatment Rehabilitation  SUBJECTIVE:   SUBJECTIVE STATEMENT: Pt alert, pleasant, and cooperative. Pt accompanied by: self  PERTINENT HISTORY: Per Neuro note, 09/15/23, "Parkinson's Disease in a patient with bilateral hand tremor (left > right) + slowness of movement + some changes in balance + hypophonia + micro graphia + hypomimia + minimal bradykinesia on the left + Mild cog wheeling on the right and moderate cog wheeling on the left + decreased arm swing on the left + slight hunched forward posture - symptomatic, without new or worsening features." Pt with hx HTN, HLD, Vit B12 and Vit deficiencies. MRI brain, 12/28/21, "no a cute infarction, hemorrhage, or mass. Mild chronic microvascular ischemic changes."    PAIN:  Are you having pain? No   FALLS: Has patient fallen in last 6 months? Defer to PT  LIVING ENVIRONMENT: Lives with: lives with their spouse Lives in: House/apartment  PLOF:  ambulates with single point cane; driving; indep  PATIENT GOALS  for speech to improve  OBJECTIVE:  TODAY'S TREATMENT:  Reviewed EMST including purpose,  how to complete ex's, and maintenance. Pt completed x5 sets of x5 reps of EMST with indep use of hand placement to facilitate adequate lip seal given Bell's Palsy.   Voice ex's completed as follows: Sustained /a/: x10, 85-86 dB Ascending pitch glides x10, 84 dB Descending pitch glides: x10, 84 db  Pt read x8 functional phrases twice through with loud and intentional voice, averaging 83dB.   During 10 minute speech sample, pt averaged 74 dB with min cues for vocal intensity. Mild volume decay continues.  Pt benefited from use of biofeeback using Voice Analyst app.   Pt educated re: changes to speech/voice due to Parkinson's Disease, voice ex's, EMST, SLP POC, and HEP.  PATIENT EDUCATION: Education details: as above Person educated: Patient  Education method: Programmer, multimedia; Handout Education comprehension: verbalized understanding and needs further education   HOME EXERCISE PROGRAM: Voice Ex's and Functional Reading as outlined above EMST  GOALS: Goals reviewed with patient? Yes  SHORT TERM GOALS: Target date: 10 sessions  Pt will participate in further assessment of functional cognitive-linguistic ability with additional goals to be set as appropriate. Baseline: Goal status: MET  2.  The patient will complete HEP (Maximum duration "ah", High/Lows, and Functional Phrases) at average loudness >/= 80 dB and with loud, good quality voice with min cues.  Baseline: Goal status: MET  3.  Pt will sustain conversational loudness (75-85 dB) for 3-5 minutes of conversation with min cues. Baseline:  Goal status: MET  LONG TERM GOALS: Target date: 12 weeks  The patient will complete HEP (Maximum duration "ah", High/Lows, and Functional Phrases) at average loudness >/= 80 dB and with loud, good quality voice indep. Baseline: Goal status: PROGRESSING  Pt will sustain conversational loudness (75-85 dB) for 8-10 minutes of conversation with min cues. Baseline:  Goal status:  PROGRESSING   ASSESSMENT:  CLINICAL IMPRESSION: Patient is a 78 y.o. male who was seen today for evaluation in setting of Parkinson's Disease. er Neuro note, 09/15/23, "Parkinson's Disease in a patient with bilateral hand tremor (left > right) + slowness of movement + some changes in balance + hypophonia + micro graphia + hypomimia + minimal bradykinesia on the left + Mild cog wheeling on the right and moderate cog wheeling on the left + decreased arm swing on the left + slight hunched forward posture - symptomatic, without new or worsening features." Pt taking carbidopa-levodopa for Parkinson's Disease with recent increased in dosage.  Pt with hx HTN, HLD, Vit B12 and Vit deficiencies. MRI brain, 12/28/21, "no a cute infarction, hemorrhage, or mass. Mild chronic microvascular ischemic changes." Pt presents with a mild hypokinetic dysarthric c/b hypophonia, mildly hoarse vocal quality and intermittent fast rushes of speech. Evidence of volume decay noted. Wife endorsed that speech is worse in home with pt often "muttering." Wife also identified x2 persons who have stopped communicating with pt due to difficulty hearing him. Pt stimulable to verbal cues and would benefit from course of ST to improve pt's ability to communicate at home and in the community.  OBJECTIVE IMPAIRMENTS include dysarthria. These impairments are limiting patient from effectively communicating at home and in community. Factors affecting potential to achieve goals and functional outcome are n/a. Patient will benefit from skilled SLP services to address above impairments and improve overall function.  REHAB POTENTIAL: Good  PLAN: SLP FREQUENCY: 1-2x/week  SLP DURATION: 12 weeks  PLANNED INTERVENTIONS: Functional tasks, Multimodal communication approach, SLP instruction and feedback, Compensatory strategies, Patient/family education, and further cognitive-linguistic assessment.   Dia Forget, M.S., CCC-SLP Speech-Language  Pathologist [place] [facility] [phone] [place])    [facility] at [facility] [address] [place], [place], [address] Phone: [phone]   Fax:  [phone]

## 2023-11-09 NOTE — Therapy (Signed)
 OUTPATIENT PHYSICAL THERAPY NEURO TREATMENT    Patient Name: Derrick Bishop MRN: [id number] DOB:1946-01-21, 78 y.o., male Today's Date: 11/09/2023   PCP: Dr. Firman Hughes REFERRING PROVIDER: Dr. Devora Folks  END OF SESSION:  PT End of Session - 11/09/23 1401     Visit Number 12    Number of Visits 24    Date for PT Re-Evaluation 12/26/23    Progress Note Due on Visit 10    PT Start Time 1400    PT Stop Time 1444    PT Time Calculation (min) 44 min    Equipment Utilized During Treatment Gait belt    Activity Tolerance Patient tolerated treatment well    Behavior During Therapy WFL for tasks assessed/performed                 Past Medical History:  Diagnosis Date   Hypertension    History reviewed. No pertinent surgical history. There are no active problems to display for this patient.   ONSET DATE: 3 years ago  REFERRING DIAG: Parkinson's disease without dyskinesia, without mention of fluctuations   THERAPY DIAG:  Difficulty in walking, not elsewhere classified  Abnormality of gait and mobility  Muscle weakness (generalized)  Other lack of coordination  Unsteadiness on feet  Rationale for Evaluation and Treatment: Rehabilitation  SUBJECTIVE:                                                                                                                                                                                             SUBJECTIVE STATEMENT:  Patient reports some days are better than others- feels like today is a "Shaky" day -more than normal   Pt accompanied by: self  PERTINENT HISTORY:  History obtained by Dr. Mason Sole on 09/15/2023, the below from that note: Assessment & Plan Parkinson's Disease in a patient with bilateral hand tremor (left > right) + slowness of movement + some changes in balance + hypophonia + micro graphia + hypomimia + minimal bradykinesia on the left + Mild cog wheeling on the right and moderate cog wheeling on the left  + decreased arm swing on the left + slight hunched forward posture - symptomatic, without new or worsening features   Parkinson's disease with worsening symptoms, including increased hand tremors, balance problems, and freezing episodes. Current treatment with carbidopa-levodopa three times daily shows no noticeable symptom improvement. Increasing the dose aims to reduce tremors, bradykinesia, and rigidity. The medication is safe with no long-term damage, and higher doses may be necessary as the disease progresses. Early and adequate dosing can preserve functional years, and the highest tolerable dose should  be administered early to maximize functional outcomes. - Increase carbidopa-levodopa to 25-100 mg two times a day  Increase carbidopa-levodopa to 1.5 pills three times daily for two weeks, then increase to 2 pills three times daily. - Order Parkinson's-specific Physical Therapy and Speech Therapy   PAIN:  Are you having pain? No  PRECAUTIONS: Fall  RED FLAGS: None   WEIGHT BEARING RESTRICTIONS: No  FALLS: Has patient fallen in last 6 months? No  LIVING ENVIRONMENT: Lives with: lives with their spouse Lives in: House/apartment Stairs: Yes: External: 3 steps; In process of having railings installed in garage Has following equipment at home: Single point cane  PLOF: Independent  PATIENT GOALS: improve my walking and confidence in getting around.   OBJECTIVE:  Note: Objective measures were completed at Evaluation unless otherwise noted.  DIAGNOSTIC FINDINGS:  Narrative & Impression  CLINICAL DATA:  Neuro deficit, acute, stroke suspected   EXAM: MRI HEAD WITHOUT CONTRAST   TECHNIQUE: Multiplanar, multiecho pulse sequences of the brain and surrounding structures were obtained without intravenous contrast.   COMPARISON:  2012   FINDINGS: Brain: There is no acute infarction or intracranial hemorrhage. There is no intracranial mass, mass effect, or edema. There is  no hydrocephalus or extra-axial fluid collection. Ventricles and sulci are within normal limits in size and configuration. Patchy T2 hyperintensity in the supratentorial white matter is nonspecific but may reflect mild chronic microvascular ischemic changes.   Vascular: Major vessel flow voids at the skull base are preserved.   Skull and upper cervical spine: Normal marrow signal is preserved.   Sinuses/Orbits: Paranasal sinus mucosal thickening. Orbits are unremarkable.   Other: Sella is unremarkable.  Mastoid air cells are clear.   IMPRESSION: No acute infarction, hemorrhage, or mass.   Mild chronic microvascular ischemic changes.     Electronically Signed   By: Geannie Keener M.D.   On: 12/28/2021 13:07      COGNITION: Overall cognitive status: Within functional limits for tasks assessed   SENSATION: WFL  COORDINATION: Slow to initiate at times  EDEMA:  None observed    POSTURE: rounded shoulders and forward head  LOWER EXTREMITY ROM:     Active  Right Eval Left Eval  Hip flexion    Hip extension    Hip abduction    Hip adduction    Hip internal rotation    Hip external rotation    Knee flexion    Knee extension    Ankle dorsiflexion    Ankle plantarflexion    Ankle inversion    Ankle eversion     (Blank rows = not tested)  LOWER EXTREMITY MMT:    MMT Right Eval Left Eval  Hip flexion 4 4  Hip extension 4 4  Hip abduction 4 4  Hip adduction    Hip internal rotation 4 4  Hip external rotation 4 4  Knee flexion 4 4  Knee extension 4 4  Ankle dorsiflexion 4 4  Ankle plantarflexion    Ankle inversion    Ankle eversion    (Blank rows = not tested)  BED MOBILITY:  NOT TESTED- Patient reports independent  TRANSFERS: Assistive device utilized: None  Sit to stand: Complete Independence Stand to sit: Complete Independence Chair to chair: Complete Independence Floor: Not tested  GAIT: Gait pattern: decreased arm swing- Right,  decreased arm swing- Left, decreased step length- Right, decreased step length- Left, decreased stance time- Right, decreased stance time- Left, decreased stride length, shuffling, festinating, poor foot clearance- Right, and  poor foot clearance- Left Distance walked: > 100 feet Assistive device utilized: has cane today but did not use- states only using in unfamiliar community settings Level of assistance: CGA   FUNCTIONAL TESTS:  5 times sit to stand: 18.81 sec without UE support Timed up and go (TUG): 16.03 sec avg without AD 6 minute walk test: 937 ftt 10 meter walk test: 0.79 m/s avg without AD Berg Balance Scale: 43/56  PATIENT SURVEYS:  ABC scale 75.6%                                                                                                                              TREATMENT DATE: 11/09/23     Neuromuscular Re-Education: neuromuscular  reeducation of movement, balance, coordination, kinesthetic sense, posture and proprioception for sitting and/or standing   Heel to toe gait sequencing activity: RTB around distal ankle and instructed  to swing LE forward toward 1/2 foam roll and landing heel on floor and toe box on pad x 12 reps with RTB and then another 10 reps without resistance.  TURNING: 180 deg in //bars- no freezing x 20 times (combined with below activity)cues for decreased steps in 180deg turn with clock visual.   Agility ladder-focusing on heel to toe gait sequencing- in //bars x 10 down and back  Weighted gait training with 5# AW x 330 ft. Cues for step length, decreased cadence at times, and improved heel contact.Intermittent VC for carryover- No difficulty or slowing down through doorways or turning.    Throughout gait in parallel bars, cues for decreased steps in 180deg turn with clock visual. Cues for stepping towards far leg of chair when turning to sit to reduce festination.  Throughout session PT provided CGA for safety and gait belt in place for  increased safety.    Gait in 10 feet (focus in on heel to toe along with increasing step length) fwd- 6-8 steps total x 10 and then retro- initially 14 steps down to 8 on last trial (of 10). (No festination observed during activity)       PATIENT EDUCATION: Education details: large amplitude technique, HEP,  Person educated: Patient Education method: Medical illustrator, VC printout Education comprehension: verbalized understanding, returned demonstration, and verbal cues required  HOME EXERCISE PROGRAM:  Access Code: [email or web address] URL: [email or web address] Date: 10/18/2023 Prepared by: Carlen Chasten  Exercises - Walking  - 1 x daily - 7 x weekly - 1 sets - 1 reps - 6-10 minutes hold - Sit to Stand Without Arm Support  - 1 x daily - 5-6 x weekly - 2-3 sets - 10 reps - Seated Finger Flicks with Elbow Extension  - 1 x daily - 7 x weekly - 2 sets - 20 reps - Seated Reaching to Side and Across Body  - 1 x daily - 7 x weekly - 2 sets - 10 reps  GOALS: Goals reviewed with patient? Yes  SHORT TERM GOALS: Target date: 11/14/2023  Pt will be independent with HEP in order to improve strength and balance in order to decrease fall risk and improve function at home and work. Baseline: 5/7: Every other day, tries to incorporate into everyday tasks  Goal status: ONGOING   LONG TERM GOALS: Target date: 12/26/2023  1.  Patient (> 73 years old) will complete five times sit to stand test in < 15 seconds indicating an increased LE strength and improved balance. Baseline: EVAL= 18.81 sec without UE 11/02/23: 9.26 sec  Goal status: ONGOING  2.  Patient will improve ABC score to >80%   to demonstrate statistically significant improvement in mobility and quality of life as it relates to their confidence in his balance.  Baseline: EVAL=75.6 % 5/7: 85% Goal status: INITIAL   4.  Patient will increase Berg Balance score by > 6 points to demonstrate decreased fall risk during  functional activities. Baseline: EVAL= 43/56 5/7: 47 Goal status: ONGOING   5.   Patient will reduce timed up and go to <11 seconds to reduce fall risk and demonstrate improved transfer/gait ability. Baseline: EVAL= 16.03 without AD 5/7:12.43 sec Goal status: MET   6.   Patient will increase 10 meter walk test to >1.47m/s as to improve gait speed for better community ambulation and to reduce fall risk. Baseline: EVAL= 0.79 m/s avg without AD 5/7:1.02 m/s Goal status: MET  7.   Patient will increase six minute walk test distance to >1000 for progression to community ambulator and improve gait ability Baseline: EVAL: To be tested next visit; 10/05/23: 937 ft 5/7:1020 ft Goal status: MET    ASSESSMENT:  CLINICAL IMPRESSION:   Patient presents with excellent motivation. PT treatment continued  focused on increased step length and large amplitude movement initially with use of ladder and resistive theraband activity and later with resistive gait training. If distracted or fatigued patient exhibited more shuffling gait - but if provided visual feedback including ladder or foam roll he was able to increase step length  well today.  No festination with turning today. Pt will continue to benefit from skilled physical therapy intervention to address impairments, improve QOL, and attain therapy goals.     OBJECTIVE IMPAIRMENTS: Abnormal gait, decreased balance, decreased coordination, decreased endurance, decreased mobility, difficulty walking, decreased ROM, decreased strength, hypomobility, impaired flexibility, and postural dysfunction.   ACTIVITY LIMITATIONS: carrying, lifting, bending, standing, squatting, stairs, and transfers  PARTICIPATION LIMITATIONS: cleaning, shopping, community activity, and yard work  PERSONAL FACTORS: 1-2 comorbidities: HTN are also affecting patient's functional outcome.   REHAB POTENTIAL: Good  CLINICAL DECISION MAKING: Stable/uncomplicated  EVALUATION  COMPLEXITY: Low  PLAN:  PT FREQUENCY: 1-2x/week  PT DURATION: 12 weeks  PLANNED INTERVENTIONS: 97164- PT Re-evaluation, 97110-Therapeutic exercises, 97530- Therapeutic activity, [id number]- Neuromuscular re-education, 97535- Self Care, 91478- Manual therapy, [id number]- Gait training, [phone]- Orthotic Fit/training, [phone]- Canalith repositioning, [phone]- Electrical stimulation (manual), Patient/Family education, Balance training, Stair training, Taping, Dry Needling, Joint mobilization, Joint manipulation, Spinal manipulation, Spinal mobilization, Vestibular training, DME instructions, Cryotherapy, and Moist heat  PLAN FOR NEXT SESSION:   - continue dynamic gait training addressing freezing when turning; using clock face reference.  - backwards gait training - dual-task challenges and/or motor sequencing challenges could use Blaze Pods - progress HEP as appropriate - Postural strengthening/trunk and LE ROM to assist with flexibility  Murlene Army PT  Physical Therapist- [facility]   3:59 PM 11/09/23

## 2023-11-14 NOTE — Therapy (Signed)
 OUTPATIENT PHYSICAL THERAPY NEURO TREATMENT    Patient Name: Derrick Bishop MRN: [id number] DOB:[birth year], 78 y.o., male Today's Date: 11/15/2023   PCP: Dr. Firman Hughes REFERRING PROVIDER: Dr. Devora Folks  END OF SESSION:  PT End of Session - 11/15/23 1104     Visit Number 13    Number of Visits 24    Date for PT Re-Evaluation 12/26/23    Progress Note Due on Visit 10    PT Start Time 1146    PT Stop Time 1229    PT Time Calculation (min) 43 min    Equipment Utilized During Treatment Gait belt    Activity Tolerance Patient tolerated treatment well    Behavior During Therapy WFL for tasks assessed/performed                  Past Medical History:  Diagnosis Date   Hypertension    History reviewed. No pertinent surgical history. There are no active problems to display for this patient.   ONSET DATE: 3 years ago  REFERRING DIAG: Parkinson's disease without dyskinesia, without mention of fluctuations   THERAPY DIAG:  Difficulty in walking, not elsewhere classified  Abnormality of gait and mobility  Muscle weakness (generalized)  Rationale for Evaluation and Treatment: Rehabilitation  SUBJECTIVE:                                                                                                                                                                                             SUBJECTIVE STATEMENT:  Patient reports he tries to be careful. No falls or LOB since last session.    Pt accompanied by: self  PERTINENT HISTORY:  History obtained by Dr. Mason Sole on 09/15/2023, the below from that note: Assessment & Plan Parkinson's Disease in a patient with bilateral hand tremor (left > right) + slowness of movement + some changes in balance + hypophonia + micro graphia + hypomimia + minimal bradykinesia on the left + Mild cog wheeling on the right and moderate cog wheeling on the left + decreased arm swing on the left + slight hunched forward posture -  symptomatic, without new or worsening features   Parkinson's disease with worsening symptoms, including increased hand tremors, balance problems, and freezing episodes. Current treatment with carbidopa-levodopa three times daily shows no noticeable symptom improvement. Increasing the dose aims to reduce tremors, bradykinesia, and rigidity. The medication is safe with no long-term damage, and higher doses may be necessary as the disease progresses. Early and adequate dosing can preserve functional years, and the highest tolerable dose should be administered early to maximize functional outcomes. - Increase carbidopa-levodopa to  25-100 mg two times a day  Increase carbidopa-levodopa to 1.5 pills three times daily for two weeks, then increase to 2 pills three times daily. - Order Parkinson's-specific Physical Therapy and Speech Therapy   PAIN:  Are you having pain? No  PRECAUTIONS: Fall  RED FLAGS: None   WEIGHT BEARING RESTRICTIONS: No  FALLS: Has patient fallen in last 6 months? No  LIVING ENVIRONMENT: Lives with: lives with their spouse Lives in: House/apartment Stairs: Yes: External: 3 steps; In process of having railings installed in garage Has following equipment at home: Single point cane  PLOF: Independent  PATIENT GOALS: improve my walking and confidence in getting around.   OBJECTIVE:  Note: Objective measures were completed at Evaluation unless otherwise noted.  DIAGNOSTIC FINDINGS:  Narrative & Impression  CLINICAL DATA:  Neuro deficit, acute, stroke suspected   EXAM: MRI HEAD WITHOUT CONTRAST   TECHNIQUE: Multiplanar, multiecho pulse sequences of the brain and surrounding structures were obtained without intravenous contrast.   COMPARISON:  2012   FINDINGS: Brain: There is no acute infarction or intracranial hemorrhage. There is no intracranial mass, mass effect, or edema. There is no hydrocephalus or extra-axial fluid collection. Ventricles and sulci are  within normal limits in size and configuration. Patchy T2 hyperintensity in the supratentorial white matter is nonspecific but may reflect mild chronic microvascular ischemic changes.   Vascular: Major vessel flow voids at the skull base are preserved.   Skull and upper cervical spine: Normal marrow signal is preserved.   Sinuses/Orbits: Paranasal sinus mucosal thickening. Orbits are unremarkable.   Other: Sella is unremarkable.  Mastoid air cells are clear.   IMPRESSION: No acute infarction, hemorrhage, or mass.   Mild chronic microvascular ischemic changes.     Electronically Signed   By: Geannie Keener M.D.   On: 12/28/2021 13:07      COGNITION: Overall cognitive status: Within functional limits for tasks assessed   SENSATION: WFL  COORDINATION: Slow to initiate at times  EDEMA:  None observed    POSTURE: rounded shoulders and forward head  LOWER EXTREMITY ROM:     Active  Right Eval Left Eval  Hip flexion    Hip extension    Hip abduction    Hip adduction    Hip internal rotation    Hip external rotation    Knee flexion    Knee extension    Ankle dorsiflexion    Ankle plantarflexion    Ankle inversion    Ankle eversion     (Blank rows = not tested)  LOWER EXTREMITY MMT:    MMT Right Eval Left Eval  Hip flexion 4 4  Hip extension 4 4  Hip abduction 4 4  Hip adduction    Hip internal rotation 4 4  Hip external rotation 4 4  Knee flexion 4 4  Knee extension 4 4  Ankle dorsiflexion 4 4  Ankle plantarflexion    Ankle inversion    Ankle eversion    (Blank rows = not tested)  BED MOBILITY:  NOT TESTED- Patient reports independent  TRANSFERS: Assistive device utilized: None  Sit to stand: Complete Independence Stand to sit: Complete Independence Chair to chair: Complete Independence Floor: Not tested  GAIT: Gait pattern: decreased arm swing- Right, decreased arm swing- Left, decreased step length- Right, decreased step length-  Left, decreased stance time- Right, decreased stance time- Left, decreased stride length, shuffling, festinating, poor foot clearance- Right, and poor foot clearance- Left Distance walked: > 100 feet Assistive device  utilized: has cane today but did not use- states only using in unfamiliar community settings Level of assistance: CGA   FUNCTIONAL TESTS:  5 times sit to stand: 18.81 sec without UE support Timed up and go (TUG): 16.03 sec avg without AD 6 minute walk test: 937 ftt 10 meter walk test: 0.79 m/s avg without AD Berg Balance Scale: 43/56  PATIENT SURVEYS:  ABC scale 75.6%                                                                                                                              TREATMENT DATE: 11/15/23     Neuromuscular Re-Education: neuromuscular  reeducation of movement, balance, coordination, kinesthetic sense, posture and proprioception for sitting and/or standing      TURNING: 180 deg in //bars- no freezing x 20 times (combined with below activity)cues for decreased steps in 180deg turn with clock visual.    Agility ladder-focusing on heel to toe gait sequencing- in //bars x 10 down and back; addition of arm swing focus;   Agility ladder- two feet per square lateral step 10x length  Seated grab a cone; walk with large steps to end of // bars place cones on cart, turn 180 and come back to sit down x 8 trials  Large step with large arms laterally 10x; each LE  Large lateral step with arm reach 10x each LE   Rock and reach 10x each side;  Airex balance beam: -PVC pipe chest press 10x, overhead press 10x -lateral step 4x length of // bars  TherAct:  Gait training with focus on arm swing with use of PVC pipes 4x 180 ft   18" step toe taps 12x each LE  18" step up modified to power up 10x each LE with UE support  Weighted ball sit to stand 10x; (orange ball)    Throughout session PT provided CGA for safety and gait belt in place for  increased safety.      PATIENT EDUCATION: Education details: large amplitude technique, HEP,  Person educated: Patient Education method: Medical illustrator, VC printout Education comprehension: verbalized understanding, returned demonstration, and verbal cues required  HOME EXERCISE PROGRAM:  Access Code: [email or web address] URL: [email or web address] Date: 10/18/2023 Prepared by: Carlen Chasten  Exercises - Walking  - 1 x daily - 7 x weekly - 1 sets - 1 reps - 6-10 minutes hold - Sit to Stand Without Arm Support  - 1 x daily - 5-6 x weekly - 2-3 sets - 10 reps - Seated Finger Flicks with Elbow Extension  - 1 x daily - 7 x weekly - 2 sets - 20 reps - Seated Reaching to Side and Across Body  - 1 x daily - 7 x weekly - 2 sets - 10 reps  GOALS: Goals reviewed with patient? Yes  SHORT TERM GOALS: Target date: 11/14/2023  Pt will be independent with HEP in order to improve strength and balance in order to decrease  fall risk and improve function at home and work. Baseline: 5/7: Every other day, tries to incorporate into everyday tasks  Goal status: ONGOING   LONG TERM GOALS: Target date: 12/26/2023  1.  Patient (> 77 years old) will complete five times sit to stand test in < 15 seconds indicating an increased LE strength and improved balance. Baseline: EVAL= 18.81 sec without UE 11/02/23: 9.26 sec  Goal status: ONGOING  2.  Patient will improve ABC score to >80%   to demonstrate statistically significant improvement in mobility and quality of life as it relates to their confidence in his balance.  Baseline: EVAL=75.6 % 5/7: 85% Goal status: INITIAL   4.  Patient will increase Berg Balance score by > 6 points to demonstrate decreased fall risk during functional activities. Baseline: EVAL= 43/56 5/7: 47 Goal status: ONGOING   5.   Patient will reduce timed up and go to <11 seconds to reduce fall risk and demonstrate improved transfer/gait ability. Baseline: EVAL=  16.03 without AD 5/7:12.43 sec Goal status: MET   6.   Patient will increase 10 meter walk test to >1.37m/s as to improve gait speed for better community ambulation and to reduce fall risk. Baseline: EVAL= 0.79 m/s avg without AD 5/7:1.02 m/s Goal status: MET  7.   Patient will increase six minute walk test distance to >1000 for progression to community ambulator and improve gait ability Baseline: EVAL: To be tested next visit; 10/05/23: 937 ft 5/7:1020 ft Goal status: MET    ASSESSMENT:  CLINICAL IMPRESSION:   Patient has improved step length with use of visual cue of speed ladder. Increased arm swing assisted in carryover of step length allowing for progression of ambulation in hallway. 18 " step utilized for large step and power step with patient reporting some fatigue.  Pt will continue to benefit from skilled physical therapy intervention to address impairments, improve QOL, and attain therapy goals.     OBJECTIVE IMPAIRMENTS: Abnormal gait, decreased balance, decreased coordination, decreased endurance, decreased mobility, difficulty walking, decreased ROM, decreased strength, hypomobility, impaired flexibility, and postural dysfunction.   ACTIVITY LIMITATIONS: carrying, lifting, bending, standing, squatting, stairs, and transfers  PARTICIPATION LIMITATIONS: cleaning, shopping, community activity, and yard work  PERSONAL FACTORS: 1-2 comorbidities: HTN are also affecting patient's functional outcome.   REHAB POTENTIAL: Good  CLINICAL DECISION MAKING: Stable/uncomplicated  EVALUATION COMPLEXITY: Low  PLAN:  PT FREQUENCY: 1-2x/week  PT DURATION: 12 weeks  PLANNED INTERVENTIONS: 97164- PT Re-evaluation, 97110-Therapeutic exercises, 97530- Therapeutic activity, [id number]- Neuromuscular re-education, 97535- Self Care, 16109- Manual therapy, [id number]- Gait training, [phone]- Orthotic Fit/training, [phone]- Canalith repositioning, [phone]- Electrical stimulation (manual), Patient/Family  education, Balance training, Stair training, Taping, Dry Needling, Joint mobilization, Joint manipulation, Spinal manipulation, Spinal mobilization, Vestibular training, DME instructions, Cryotherapy, and Moist heat  PLAN FOR NEXT SESSION:   - continue dynamic gait training addressing freezing when turning; using clock face reference.  - backwards gait training - dual-task challenges and/or motor sequencing challenges could use Blaze Pods - progress HEP as appropriate - Postural strengthening/trunk and LE ROM to assist with flexibility  Philomina Leon  Brain Cahill PT  Physical Therapist- [facility]   12:49 PM 11/15/23

## 2023-11-15 ENCOUNTER — Ambulatory Visit

## 2023-11-15 DIAGNOSIS — R269 Unspecified abnormalities of gait and mobility: Secondary | ICD-10-CM

## 2023-11-15 DIAGNOSIS — R471 Dysarthria and anarthria: Secondary | ICD-10-CM

## 2023-11-15 DIAGNOSIS — M6281 Muscle weakness (generalized): Secondary | ICD-10-CM

## 2023-11-15 DIAGNOSIS — R262 Difficulty in walking, not elsewhere classified: Secondary | ICD-10-CM

## 2023-11-15 DIAGNOSIS — G20A1 Parkinson's disease without dyskinesia, without mention of fluctuations: Secondary | ICD-10-CM | POA: Diagnosis not present

## 2023-11-15 NOTE — Therapy (Signed)
 OUTPATIENT SPEECH LANGUAGE PATHOLOGY PARKINSON'S TREATMENT   Patient Name: Derrick Bishop MRN: [id number] DOB:Nov 10, 1945, 78 y.o., male Today's Date: 11/15/2023  PCP: Firman Hughes, MD REFERRING PROVIDER: Devora Folks, MD    End of Session - 11/15/23 1147     Visit Number 13    Number of Visits 24    Date for SLP Re-Evaluation 12/26/23    SLP Start Time 1100    SLP Stop Time  1145    SLP Time Calculation (min) 45 min    Activity Tolerance Patient tolerated treatment well             Past Medical History:  Diagnosis Date   Hypertension    No past surgical history on file. There are no active problems to display for this patient.   ONSET DATE:  ~3 years ago;   REFERRING DIAG: Parkinson's Disease without dyskinesia, without mention of fluctuations  THERAPY DIAG:  Dysarthria and anarthria  Rationale for Evaluation and Treatment Rehabilitation  SUBJECTIVE:   SUBJECTIVE STATEMENT: Pt alert, pleasant, and cooperative. Pt accompanied by: self  PERTINENT HISTORY: Per Neuro note, 09/15/23, "Parkinson's Disease in a patient with bilateral hand tremor (left > right) + slowness of movement + some changes in balance + hypophonia + micro graphia + hypomimia + minimal bradykinesia on the left + Mild cog wheeling on the right and moderate cog wheeling on the left + decreased arm swing on the left + slight hunched forward posture - symptomatic, without new or worsening features." Pt with hx HTN, HLD, Vit B12 and Vit deficiencies. MRI brain, 12/28/21, "no a cute infarction, hemorrhage, or mass. Mild chronic microvascular ischemic changes."    PAIN:  Are you having pain? No   FALLS: Has patient fallen in last 6 months? Defer to PT  LIVING ENVIRONMENT: Lives with: lives with their spouse Lives in: House/apartment  PLOF:  ambulates with single point cane; driving; indep  PATIENT GOALS  for speech to improve  OBJECTIVE:  TODAY'S TREATMENT:  Reviewed EMST including purpose,  how to complete ex's, and maintenance. Pt completed x5 sets of x5 reps of EMST with indep use of hand placement to facilitate adequate lip seal given Bell's Palsy.   Voice ex's completed as follows: Sustained /a/: x10, 85-86 dB Ascending pitch glides x10, 84 dB Descending pitch glides: x10, 85 db  Pt read x8 functional phrases twice through with loud and intentional voice, averaging 84dB.   During 10 minute speech sample, pt averaged 74 dB with min cues for vocal intensity. Mild volume decay continues.  Pt benefited from use of biofeeback using Voice Analyst app.   Pt educated re: changes to speech/voice due to Parkinson's Disease, voice ex's, EMST, SLP POC, and HEP.  PATIENT EDUCATION: Education details: as above Person educated: Patient  Education method: Programmer, multimedia; Handout Education comprehension: verbalized understanding and needs further education   HOME EXERCISE PROGRAM: Voice Ex's and Functional Reading as outlined above EMST  GOALS: Goals reviewed with patient? Yes  SHORT TERM GOALS: Target date: 10 sessions  Pt will participate in further assessment of functional cognitive-linguistic ability with additional goals to be set as appropriate. Baseline: Goal status: MET  2.  The patient will complete HEP (Maximum duration "ah", High/Lows, and Functional Phrases) at average loudness >/= 80 dB and with loud, good quality voice with min cues.  Baseline: Goal status: MET  3.  Pt will sustain conversational loudness (75-85 dB) for 3-5 minutes of conversation with min cues. Baseline:  Goal status: MET  LONG TERM GOALS: Target date: 12 weeks  The patient will complete HEP (Maximum duration "ah", High/Lows, and Functional Phrases) at average loudness >/= 80 dB and with loud, good quality voice indep. Baseline: Goal status: PROGRESSING  Pt will sustain conversational loudness (75-85 dB) for 8-10 minutes of conversation with min cues. Baseline:  Goal status:  PROGRESSING   ASSESSMENT:  CLINICAL IMPRESSION: Patient is a 78 y.o. male who was seen today for evaluation in setting of Parkinson's Disease. er Neuro note, 09/15/23, "Parkinson's Disease in a patient with bilateral hand tremor (left > right) + slowness of movement + some changes in balance + hypophonia + micro graphia + hypomimia + minimal bradykinesia on the left + Mild cog wheeling on the right and moderate cog wheeling on the left + decreased arm swing on the left + slight hunched forward posture - symptomatic, without new or worsening features." Pt taking carbidopa-levodopa for Parkinson's Disease with recent increased in dosage.  Pt with hx HTN, HLD, Vit B12 and Vit deficiencies. MRI brain, 12/28/21, "no a cute infarction, hemorrhage, or mass. Mild chronic microvascular ischemic changes." Pt presents with a mild hypokinetic dysarthric c/b hypophonia, mildly hoarse vocal quality and intermittent fast rushes of speech. Evidence of volume decay noted. Wife endorsed that speech is worse in home with pt often "muttering." Wife also identified x2 persons who have stopped communicating with pt due to difficulty hearing him. Pt stimulable to verbal cues and would benefit from course of ST to improve pt's ability to communicate at home and in the community.  OBJECTIVE IMPAIRMENTS include dysarthria. These impairments are limiting patient from effectively communicating at home and in community. Factors affecting potential to achieve goals and functional outcome are n/a. Patient will benefit from skilled SLP services to address above impairments and improve overall function.  REHAB POTENTIAL: Good  PLAN: SLP FREQUENCY: 1-2x/week  SLP DURATION: 12 weeks  PLANNED INTERVENTIONS: Functional tasks, Multimodal communication approach, SLP instruction and feedback, Compensatory strategies, Patient/family education, and further cognitive-linguistic assessment.   Dia Forget, M.S., CCC-SLP Speech-Language  Pathologist [facility] [phone] [place])   [facility] [address] [place], [place], [address] Phone: [phone]   Fax:  [phone]

## 2023-11-17 ENCOUNTER — Ambulatory Visit

## 2023-11-17 ENCOUNTER — Ambulatory Visit: Admitting: Physical Therapy

## 2023-11-17 DIAGNOSIS — R269 Unspecified abnormalities of gait and mobility: Secondary | ICD-10-CM

## 2023-11-17 DIAGNOSIS — G20A1 Parkinson's disease without dyskinesia, without mention of fluctuations: Secondary | ICD-10-CM | POA: Diagnosis not present

## 2023-11-17 DIAGNOSIS — R471 Dysarthria and anarthria: Secondary | ICD-10-CM

## 2023-11-17 DIAGNOSIS — M6281 Muscle weakness (generalized): Secondary | ICD-10-CM

## 2023-11-17 DIAGNOSIS — R262 Difficulty in walking, not elsewhere classified: Secondary | ICD-10-CM

## 2023-11-17 DIAGNOSIS — R2681 Unsteadiness on feet: Secondary | ICD-10-CM

## 2023-11-17 NOTE — Therapy (Signed)
 OUTPATIENT PHYSICAL THERAPY NEURO TREATMENT    Patient Name: Derrick Bishop MRN: [id number] DOB:[birth year], 78 y.o., male Today's Date: 11/22/2023   PCP: Dr. Firman Hughes REFERRING PROVIDER: Dr. Devora Folks  END OF SESSION:  PT End of Session - 11/22/23 1358     Visit Number 15    Number of Visits 24    Date for PT Re-Evaluation 12/26/23    Progress Note Due on Visit 20    PT Start Time 1400    PT Stop Time 1444    PT Time Calculation (min) 44 min    Equipment Utilized During Treatment Gait belt    Activity Tolerance Patient tolerated treatment well    Behavior During Therapy WFL for tasks assessed/performed                   Past Medical History:  Diagnosis Date   Hypertension    History reviewed. No pertinent surgical history. There are no active problems to display for this patient.   ONSET DATE: 3 years ago  REFERRING DIAG: Parkinson's disease without dyskinesia, without mention of fluctuations   THERAPY DIAG:  Dysarthria and anarthria  Difficulty in walking, not elsewhere classified  Abnormality of gait and mobility  Muscle weakness (generalized)  Rationale for Evaluation and Treatment: Rehabilitation  SUBJECTIVE:                                                                                                                                                                                             SUBJECTIVE STATEMENT:  Patient reports he is doing well. No unusual aches or pains.     Pt accompanied by: self  PERTINENT HISTORY:  History obtained by Dr. Mason Sole on 09/15/2023, the below from that note: Assessment & Plan Parkinson's Disease in a patient with bilateral hand tremor (left > right) + slowness of movement + some changes in balance + hypophonia + micro graphia + hypomimia + minimal bradykinesia on the left + Mild cog wheeling on the right and moderate cog wheeling on the left + decreased arm swing on the left + slight hunched forward  posture - symptomatic, without new or worsening features   Parkinson's disease with worsening symptoms, including increased hand tremors, balance problems, and freezing episodes. Current treatment with carbidopa-levodopa three times daily shows no noticeable symptom improvement. Increasing the dose aims to reduce tremors, bradykinesia, and rigidity. The medication is safe with no long-term damage, and higher doses may be necessary as the disease progresses. Early and adequate dosing can preserve functional years, and the highest tolerable dose should be administered early to maximize functional outcomes. -  Increase carbidopa-levodopa to 25-100 mg two times a day  Increase carbidopa-levodopa to 1.5 pills three times daily for two weeks, then increase to 2 pills three times daily. - Order Parkinson's-specific Physical Therapy and Speech Therapy   PAIN:  Are you having pain? No  PRECAUTIONS: Fall  RED FLAGS: None   WEIGHT BEARING RESTRICTIONS: No  FALLS: Has patient fallen in last 6 months? No  LIVING ENVIRONMENT: Lives with: lives with their spouse Lives in: House/apartment Stairs: Yes: External: 3 steps; In process of having railings installed in garage Has following equipment at home: Single point cane  PLOF: Independent  PATIENT GOALS: improve my walking and confidence in getting around.   OBJECTIVE:  Note: Objective measures were completed at Evaluation unless otherwise noted.  DIAGNOSTIC FINDINGS:  Narrative & Impression  CLINICAL DATA:  Neuro deficit, acute, stroke suspected   EXAM: MRI HEAD WITHOUT CONTRAST   TECHNIQUE: Multiplanar, multiecho pulse sequences of the brain and surrounding structures were obtained without intravenous contrast.   COMPARISON:  2012   FINDINGS: Brain: There is no acute infarction or intracranial hemorrhage. There is no intracranial mass, mass effect, or edema. There is no hydrocephalus or extra-axial fluid collection. Ventricles and  sulci are within normal limits in size and configuration. Patchy T2 hyperintensity in the supratentorial white matter is nonspecific but may reflect mild chronic microvascular ischemic changes.   Vascular: Major vessel flow voids at the skull base are preserved.   Skull and upper cervical spine: Normal marrow signal is preserved.   Sinuses/Orbits: Paranasal sinus mucosal thickening. Orbits are unremarkable.   Other: Sella is unremarkable.  Mastoid air cells are clear.   IMPRESSION: No acute infarction, hemorrhage, or mass.   Mild chronic microvascular ischemic changes.     Electronically Signed   By: Geannie Keener M.D.   On: 12/28/2021 13:07      COGNITION: Overall cognitive status: Within functional limits for tasks assessed   SENSATION: WFL  COORDINATION: Slow to initiate at times  EDEMA:  None observed    POSTURE: rounded shoulders and forward head  LOWER EXTREMITY ROM:     Active  Right Eval Left Eval  Hip flexion    Hip extension    Hip abduction    Hip adduction    Hip internal rotation    Hip external rotation    Knee flexion    Knee extension    Ankle dorsiflexion    Ankle plantarflexion    Ankle inversion    Ankle eversion     (Blank rows = not tested)  LOWER EXTREMITY MMT:    MMT Right Eval Left Eval  Hip flexion 4 4  Hip extension 4 4  Hip abduction 4 4  Hip adduction    Hip internal rotation 4 4  Hip external rotation 4 4  Knee flexion 4 4  Knee extension 4 4  Ankle dorsiflexion 4 4  Ankle plantarflexion    Ankle inversion    Ankle eversion    (Blank rows = not tested)  BED MOBILITY:  NOT TESTED- Patient reports independent  TRANSFERS: Assistive device utilized: None  Sit to stand: Complete Independence Stand to sit: Complete Independence Chair to chair: Complete Independence Floor: Not tested  GAIT: Gait pattern: decreased arm swing- Right, decreased arm swing- Left, decreased step length- Right, decreased step  length- Left, decreased stance time- Right, decreased stance time- Left, decreased stride length, shuffling, festinating, poor foot clearance- Right, and poor foot clearance- Left Distance walked: > 100  feet Assistive device utilized: has cane today but did not use- states only using in unfamiliar community settings Level of assistance: CGA   FUNCTIONAL TESTS:  5 times sit to stand: 18.81 sec without UE support Timed up and go (TUG): 16.03 sec avg without AD 6 minute walk test: 937 ftt 10 meter walk test: 0.79 m/s avg without AD Berg Balance Scale: 43/56  PATIENT SURVEYS:  ABC scale 75.6%                                                                                                                              TREATMENT DATE: 11/22/23    TA- To improve functional movements patterns for everyday tasks       Agility ladder-focusing on heel to toe gait sequencing-  x 10 down and back; addition of arm swing focus;    Agility ladder- two feet per square lateral step 10x length   Seated grab a cone; walk with large steps to end of // bars place cones on cart, turn 180 and come back to sit down x 8 trials   Large step with large arms laterally 10x; each LE   Large lateral step with arm reach 10x each LE    Weighted ball forward reach to floor and then up overhead 10x  Activity Description: 2 on wall three on floor lateral step to tap Activity Setting:  The Blaze Pod Random setting was chosen to enhance cognitive processing and agility, providing an unpredictable environment to simulate real-world scenarios, and fostering quick reactions and adaptability.   Number of Pods:  6 Cycles/Sets:  3 Duration (Time or Hit Count):  10  Activity Description: red=right, green=lift; 3 on table 3 on floor Activity Setting:  The Blaze Pod Random setting was chosen to enhance cognitive processing and agility, providing an unpredictable environment to simulate real-world scenarios, and fostering  quick reactions and adaptability.   Number of Pods:  6 Cycles/Sets:  3 Duration (Time or Hit Count):  20  18" toe taps 10x each LE; no UE support  18" step up modified to power up 10x each LE with UE support   Sit to stand jump 10x; very challenging for patient Mirror jump and reaches 10x very challenging    Throughout session PT provided CGA for safety and gait belt in place for increased safety.    PATIENT EDUCATION: Education details: large amplitude technique, HEP,  Person educated: Patient Education method: Medical illustrator, VC printout Education comprehension: verbalized understanding, returned demonstration, and verbal cues required  HOME EXERCISE PROGRAM:  Access Code: [email or web address] URL: [email or web address] Date: 10/18/2023 Prepared by: Carlen Chasten  Exercises - Walking  - 1 x daily - 7 x weekly - 1 sets - 1 reps - 6-10 minutes hold - Sit to Stand Without Arm Support  - 1 x daily - 5-6 x weekly - 2-3 sets - 10 reps - Seated Finger Flicks with Elbow Extension  - 1 x  daily - 7 x weekly - 2 sets - 20 reps - Seated Reaching to Side and Across Body  - 1 x daily - 7 x weekly - 2 sets - 10 reps  GOALS: Goals reviewed with patient? Yes  SHORT TERM GOALS: Target date: 11/14/2023  Pt will be independent with HEP in order to improve strength and balance in order to decrease fall risk and improve function at home and work. Baseline: 5/7: Every other day, tries to incorporate into everyday tasks  Goal status: ONGOING   LONG TERM GOALS: Target date: 12/26/2023  1.  Patient (> 19 years old) will complete five times sit to stand test in < 15 seconds indicating an increased LE strength and improved balance. Baseline: EVAL= 18.81 sec without UE 11/02/23: 9.26 sec  Goal status: ONGOING  2.  Patient will improve ABC score to >80%   to demonstrate statistically significant improvement in mobility and quality of life as it relates to their confidence in his  balance.  Baseline: EVAL=75.6 % 5/7: 85% Goal status: INITIAL   4.  Patient will increase Berg Balance score by > 6 points to demonstrate decreased fall risk during functional activities. Baseline: EVAL= 43/56 5/7: 47 Goal status: ONGOING   5.   Patient will reduce timed up and go to <11 seconds to reduce fall risk and demonstrate improved transfer/gait ability. Baseline: EVAL= 16.03 without AD 5/7:12.43 sec Goal status: MET   6.   Patient will increase 10 meter walk test to >1.36m/s as to improve gait speed for better community ambulation and to reduce fall risk. Baseline: EVAL= 0.79 m/s avg without AD 5/7:1.02 m/s Goal status: MET  7.   Patient will increase six minute walk test distance to >1000 for progression to community ambulator and improve gait ability Baseline: EVAL: To be tested next visit; 10/05/23: 937 ft 5/7:1020 ft Goal status: MET    ASSESSMENT:  CLINICAL IMPRESSION:   Patient is able to perform speed ladder outside of // bars indicating improved stability. Patient initially has freezing with lateral stepping with blaze pods that resolves with repetition of task. Improved coordination resutls in decreased freezing and instability.  Jumping is very challenging and is an area of continued focus in future session for power movements.  Pt will continue to benefit from skilled physical therapy intervention to address impairments, improve QOL, and attain therapy goals.     OBJECTIVE IMPAIRMENTS: Abnormal gait, decreased balance, decreased coordination, decreased endurance, decreased mobility, difficulty walking, decreased ROM, decreased strength, hypomobility, impaired flexibility, and postural dysfunction.   ACTIVITY LIMITATIONS: carrying, lifting, bending, standing, squatting, stairs, and transfers  PARTICIPATION LIMITATIONS: cleaning, shopping, community activity, and yard work  PERSONAL FACTORS: 1-2 comorbidities: HTN are also affecting patient's functional outcome.    REHAB POTENTIAL: Good  CLINICAL DECISION MAKING: Stable/uncomplicated  EVALUATION COMPLEXITY: Low  PLAN:  PT FREQUENCY: 1-2x/week  PT DURATION: 12 weeks  PLANNED INTERVENTIONS: 97164- PT Re-evaluation, 97110-Therapeutic exercises, 97530- Therapeutic activity, [id number]- Neuromuscular re-education, 97535- Self Care, 16109- Manual therapy, [id number]- Gait training, [phone]- Orthotic Fit/training, [id number]- Canalith repositioning, [phone]- Electrical stimulation (manual), Patient/Family education, Balance training, Stair training, Taping, Dry Needling, Joint mobilization, Joint manipulation, Spinal manipulation, Spinal mobilization, Vestibular training, DME instructions, Cryotherapy, and Moist heat  PLAN FOR NEXT SESSION:   - continue dynamic gait training addressing freezing when turning; using clock face reference.  - backwards gait training - dual-task challenges and/or motor sequencing challenges could use Blaze Pods - progress HEP as appropriate - Postural strengthening/trunk and LE  ROM to assist with flexibility  Cleland Simkins  Brain Cahill PT  Physical Therapist- [facility]   3:05 PM 11/22/23

## 2023-11-17 NOTE — Therapy (Signed)
 OUTPATIENT SPEECH LANGUAGE PATHOLOGY PARKINSON'S TREATMENT   Patient Name: Derrick Bishop MRN: [id number] DOB:1945/12/22, 78 y.o., male Today's Date: 11/17/2023  PCP: Firman Hughes, MD REFERRING PROVIDER: Devora Folks, MD    End of Session - 11/17/23 1505     Visit Number 14    Number of Visits 24    Date for SLP Re-Evaluation 12/26/23    SLP Start Time 1510    SLP Stop Time  1555    SLP Time Calculation (min) 45 min    Activity Tolerance Patient tolerated treatment well             Past Medical History:  Diagnosis Date   Hypertension    No past surgical history on file. There are no active problems to display for this patient.   ONSET DATE:  ~3 years ago;   REFERRING DIAG: Parkinson's Disease without dyskinesia, without mention of fluctuations  THERAPY DIAG:  Dysarthria and anarthria  Rationale for Evaluation and Treatment Rehabilitation  SUBJECTIVE:   SUBJECTIVE STATEMENT: Pt alert, pleasant, and cooperative. Pt accompanied by: self  PERTINENT HISTORY: Per Neuro note, 09/15/23, "Parkinson's Disease in a patient with bilateral hand tremor (left > right) + slowness of movement + some changes in balance + hypophonia + micro graphia + hypomimia + minimal bradykinesia on the left + Mild cog wheeling on the right and moderate cog wheeling on the left + decreased arm swing on the left + slight hunched forward posture - symptomatic, without new or worsening features." Pt with hx HTN, HLD, Vit B12 and Vit deficiencies. MRI brain, 12/28/21, "no a cute infarction, hemorrhage, or mass. Mild chronic microvascular ischemic changes."    PAIN:  Are you having pain? No   FALLS: Has patient fallen in last 6 months? Defer to PT  LIVING ENVIRONMENT: Lives with: lives with their spouse Lives in: House/apartment  PLOF:  ambulates with single point cane; driving; indep  PATIENT GOALS  for speech to improve  OBJECTIVE:  TODAY'S TREATMENT:  Reviewed EMST including purpose,  how to complete ex's, and maintenance. Pt completed x5 sets of x5 reps of EMST with indep use of hand placement to facilitate adequate lip seal given Bell's Palsy.   Voice ex's completed as follows: Sustained /a/: x10, 85 dB Ascending pitch glides x10, 84 dB Descending pitch glides: x10, 85 db  Pt read x8 functional phrases twice through with loud and intentional voice, averaging 83dB.   During 10 minute speech sample, pt averaged 75 dB with min cues for vocal intensity.   Pt benefited from use of biofeeback using Voice Analyst app.   Pt educated re: changes to speech/voice due to Parkinson's Disease, voice ex's, EMST, SLP POC, and HEP.  PATIENT EDUCATION: Education details: as above Person educated: Patient  Education method: Programmer, multimedia; Handout Education comprehension: verbalized understanding and needs further education   HOME EXERCISE PROGRAM: Voice Ex's and Functional Reading as outlined above EMST  GOALS: Goals reviewed with patient? Yes  SHORT TERM GOALS: Target date: 10 sessions  Pt will participate in further assessment of functional cognitive-linguistic ability with additional goals to be set as appropriate. Baseline: Goal status: MET  2.  The patient will complete HEP (Maximum duration "ah", High/Lows, and Functional Phrases) at average loudness >/= 80 dB and with loud, good quality voice with min cues.  Baseline: Goal status: MET  3.  Pt will sustain conversational loudness (75-85 dB) for 3-5 minutes of conversation with min cues. Baseline:  Goal status: MET  LONG TERM GOALS: Target date: 12 weeks  The patient will complete HEP (Maximum duration "ah", High/Lows, and Functional Phrases) at average loudness >/= 80 dB and with loud, good quality voice indep. Baseline: Goal status: PROGRESSING  Pt will sustain conversational loudness (75-85 dB) for 8-10 minutes of conversation with min cues. Baseline:  Goal status:  PROGRESSING   ASSESSMENT:  CLINICAL IMPRESSION: Patient is a 78 y.o. male who was seen today for evaluation in setting of Parkinson's Disease. er Neuro note, 09/15/23, "Parkinson's Disease in a patient with bilateral hand tremor (left > right) + slowness of movement + some changes in balance + hypophonia + micro graphia + hypomimia + minimal bradykinesia on the left + Mild cog wheeling on the right and moderate cog wheeling on the left + decreased arm swing on the left + slight hunched forward posture - symptomatic, without new or worsening features." Pt taking carbidopa-levodopa for Parkinson's Disease with recent increased in dosage.  Pt with hx HTN, HLD, Vit B12 and Vit deficiencies. MRI brain, 12/28/21, "no a cute infarction, hemorrhage, or mass. Mild chronic microvascular ischemic changes." Pt presents with a mild hypokinetic dysarthric c/b hypophonia, mildly hoarse vocal quality and intermittent fast rushes of speech. Evidence of volume decay noted. Wife endorsed that speech is worse in home with pt often "muttering." Wife also identified x2 persons who have stopped communicating with pt due to difficulty hearing him. Pt stimulable to verbal cues and would benefit from course of ST to improve pt's ability to communicate at home and in the community.  OBJECTIVE IMPAIRMENTS include dysarthria. These impairments are limiting patient from effectively communicating at home and in community. Factors affecting potential to achieve goals and functional outcome are n/a. Patient will benefit from skilled SLP services to address above impairments and improve overall function.  REHAB POTENTIAL: Good  PLAN: SLP FREQUENCY: 1-2x/week  SLP DURATION: 12 weeks  PLANNED INTERVENTIONS: Functional tasks, Multimodal communication approach, SLP instruction and feedback, Compensatory strategies, Patient/family education, and further cognitive-linguistic assessment.   Dia Forget, M.S., CCC-SLP Speech-Language  Pathologist [facility] [phone] [place])   [facility] [address] [place], [place], [address] Phone: [phone]   Fax:  [phone]

## 2023-11-17 NOTE — Therapy (Signed)
 OUTPATIENT PHYSICAL THERAPY NEURO TREATMENT    Patient Name: Derrick Bishop MRN: [id number] DOB:Nov 08, 1945, 78 y.o., male Today's Date: 11/17/2023   PCP: Dr. Firman Hughes REFERRING PROVIDER: Dr. Devora Folks  END OF SESSION:  PT End of Session - 11/17/23 1602     Visit Number 14    Number of Visits 24    Date for PT Re-Evaluation 12/26/23    Progress Note Due on Visit 20    PT Start Time 1615    PT Stop Time 1658    PT Time Calculation (min) 43 min    Equipment Utilized During Treatment Gait belt    Activity Tolerance Patient tolerated treatment well    Behavior During Therapy WFL for tasks assessed/performed                  Past Medical History:  Diagnosis Date   Hypertension    No past surgical history on file. There are no active problems to display for this patient.   ONSET DATE: 3 years ago  REFERRING DIAG: Parkinson's disease without dyskinesia, without mention of fluctuations   THERAPY DIAG:  Difficulty in walking, not elsewhere classified  Abnormality of gait and mobility  Muscle weakness (generalized)  Unsteadiness on feet  Rationale for Evaluation and Treatment: Rehabilitation  SUBJECTIVE:                                                                                                                                                                                             SUBJECTIVE STATEMENT:  Pt reports doing well today. Pt denies any recent falls/stumbles since prior session. Pt denies any updates to medications or medical appointment since prior session. Pt reports good compliance with HEP when time permits.     Pt accompanied by: self  PERTINENT HISTORY:  History obtained by Dr. Mason Sole on 09/15/2023, the below from that note: Assessment & Plan Parkinson's Disease in a patient with bilateral hand tremor (left > right) + slowness of movement + some changes in balance + hypophonia + micro graphia + hypomimia + minimal bradykinesia on  the left + Mild cog wheeling on the right and moderate cog wheeling on the left + decreased arm swing on the left + slight hunched forward posture - symptomatic, without new or worsening features   Parkinson's disease with worsening symptoms, including increased hand tremors, balance problems, and freezing episodes. Current treatment with carbidopa-levodopa three times daily shows no noticeable symptom improvement. Increasing the dose aims to reduce tremors, bradykinesia, and rigidity. The medication is safe with no long-term damage, and higher doses may be necessary as the disease progresses.  Early and adequate dosing can preserve functional years, and the highest tolerable dose should be administered early to maximize functional outcomes. - Increase carbidopa-levodopa to 25-100 mg two times a day  Increase carbidopa-levodopa to 1.5 pills three times daily for two weeks, then increase to 2 pills three times daily. - Order Parkinson's-specific Physical Therapy and Speech Therapy   PAIN:  Are you having pain? No  PRECAUTIONS: Fall  RED FLAGS: None   WEIGHT BEARING RESTRICTIONS: No  FALLS: Has patient fallen in last 6 months? No  LIVING ENVIRONMENT: Lives with: lives with their spouse Lives in: House/apartment Stairs: Yes: External: 3 steps; In process of having railings installed in garage Has following equipment at home: Single point cane  PLOF: Independent  PATIENT GOALS: improve my walking and confidence in getting around.   OBJECTIVE:  Note: Objective measures were completed at Evaluation unless otherwise noted.  DIAGNOSTIC FINDINGS:  Narrative & Impression  CLINICAL DATA:  Neuro deficit, acute, stroke suspected   EXAM: MRI HEAD WITHOUT CONTRAST   TECHNIQUE: Multiplanar, multiecho pulse sequences of the brain and surrounding structures were obtained without intravenous contrast.   COMPARISON:  2012   FINDINGS: Brain: There is no acute infarction or intracranial  hemorrhage. There is no intracranial mass, mass effect, or edema. There is no hydrocephalus or extra-axial fluid collection. Ventricles and sulci are within normal limits in size and configuration. Patchy T2 hyperintensity in the supratentorial white matter is nonspecific but may reflect mild chronic microvascular ischemic changes.   Vascular: Major vessel flow voids at the skull base are preserved.   Skull and upper cervical spine: Normal marrow signal is preserved.   Sinuses/Orbits: Paranasal sinus mucosal thickening. Orbits are unremarkable.   Other: Sella is unremarkable.  Mastoid air cells are clear.   IMPRESSION: No acute infarction, hemorrhage, or mass.   Mild chronic microvascular ischemic changes.     Electronically Signed   By: Geannie Keener M.D.   On: 12/28/2021 13:07      COGNITION: Overall cognitive status: Within functional limits for tasks assessed   SENSATION: WFL  COORDINATION: Slow to initiate at times  EDEMA:  None observed    POSTURE: rounded shoulders and forward head  LOWER EXTREMITY ROM:     Active  Right Eval Left Eval  Hip flexion    Hip extension    Hip abduction    Hip adduction    Hip internal rotation    Hip external rotation    Knee flexion    Knee extension    Ankle dorsiflexion    Ankle plantarflexion    Ankle inversion    Ankle eversion     (Blank rows = not tested)  LOWER EXTREMITY MMT:    MMT Right Eval Left Eval  Hip flexion 4 4  Hip extension 4 4  Hip abduction 4 4  Hip adduction    Hip internal rotation 4 4  Hip external rotation 4 4  Knee flexion 4 4  Knee extension 4 4  Ankle dorsiflexion 4 4  Ankle plantarflexion    Ankle inversion    Ankle eversion    (Blank rows = not tested)  BED MOBILITY:  NOT TESTED- Patient reports independent  TRANSFERS: Assistive device utilized: None  Sit to stand: Complete Independence Stand to sit: Complete Independence Chair to chair: Complete  Independence Floor: Not tested  GAIT: Gait pattern: decreased arm swing- Right, decreased arm swing- Left, decreased step length- Right, decreased step length- Left, decreased stance time- Right,  decreased stance time- Left, decreased stride length, shuffling, festinating, poor foot clearance- Right, and poor foot clearance- Left Distance walked: > 100 feet Assistive device utilized: has cane today but did not use- states only using in unfamiliar community settings Level of assistance: CGA   FUNCTIONAL TESTS:  5 times sit to stand: 18.81 sec without UE support Timed up and go (TUG): 16.03 sec avg without AD 6 minute walk test: 937 ftt 10 meter walk test: 0.79 m/s avg without AD Berg Balance Scale: 43/56  PATIENT SURVEYS:  ABC scale 75.6%                                                                                                                              TREATMENT DATE: 11/17/23     Neuromuscular Re-Education: neuromuscular  reeducation of movement, balance, coordination, kinesthetic sense, posture and proprioception for sitting and/or standing    Octane fitness UE and LE reciprocal movement training and aerobic priming for further activities. Level 1 x 1 min, level 3 x 5 min    Ladder drill 1 foot in ea x 6 laps, min Ue arm movement   PWR! Step standing 2 x 10 ea side   Boxing reciprocal UE and LE movement training - 2 x 10 ea UE with left punch with simultaneous right step ( opposite with right punch) PT holding punch mits for target  -same above box step with forward walk x 4 x 30 ft   Boxing with squat: 2 punches then pt "ducks" ( squats) to dodge punch 2 x 10 of the above sequence   Modified all 4 PWR! Moves using chair support : PWR! Twist x 10 ea side, PWR! Step x 10 ea side   TA Golf swing practice for thoracic rotation and comfort with completing at home x 10  PWR! Up targets postural strengthening and antigravity extension, PRW! Rock targets functional  weight shifting, PRW! Twist targets trunk rotation and PRW! Step targets transition movements. PWR! Moves target bradykinesia, rigidity, and dyskinesia through targeted functional movements that address four core movement difficulties for people with Parkinson's disease.  Throughout session PT provided CGA for safety and gait belt in place for increased safety.      PATIENT EDUCATION: Education details: large amplitude technique, HEP,  Person educated: Patient Education method: Medical illustrator, VC printout Education comprehension: verbalized understanding, returned demonstration, and verbal cues required  HOME EXERCISE PROGRAM:  Access Code: [email or web address] URL: [email or web address] Date: 10/18/2023 Prepared by: Carlen Chasten  Exercises - Walking  - 1 x daily - 7 x weekly - 1 sets - 1 reps - 6-10 minutes hold - Sit to Stand Without Arm Support  - 1 x daily - 5-6 x weekly - 2-3 sets - 10 reps - Seated Finger Flicks with Elbow Extension  - 1 x daily - 7 x weekly - 2 sets - 20 reps - Seated Reaching to Side and Across Body  - 1 x  daily - 7 x weekly - 2 sets - 10 reps  GOALS: Goals reviewed with patient? Yes  SHORT TERM GOALS: Target date: 11/14/2023  Pt will be independent with HEP in order to improve strength and balance in order to decrease fall risk and improve function at home and work. Baseline: 5/7: Every other day, tries to incorporate into everyday tasks  Goal status: ONGOING   LONG TERM GOALS: Target date: 12/26/2023  1.  Patient (> 54 years old) will complete five times sit to stand test in < 15 seconds indicating an increased LE strength and improved balance. Baseline: EVAL= 18.81 sec without UE 11/02/23: 9.26 sec  Goal status: ONGOING  2.  Patient will improve ABC score to >80%   to demonstrate statistically significant improvement in mobility and quality of life as it relates to their confidence in his balance.  Baseline: EVAL=75.6 % 5/7: 85% Goal  status: INITIAL   4.  Patient will increase Berg Balance score by > 6 points to demonstrate decreased fall risk during functional activities. Baseline: EVAL= 43/56 5/7: 47 Goal status: ONGOING   5.   Patient will reduce timed up and go to <11 seconds to reduce fall risk and demonstrate improved transfer/gait ability. Baseline: EVAL= 16.03 without AD 5/7:12.43 sec Goal status: MET   6.   Patient will increase 10 meter walk test to >1.16m/s as to improve gait speed for better community ambulation and to reduce fall risk. Baseline: EVAL= 0.79 m/s avg without AD 5/7:1.02 m/s Goal status: MET  7.   Patient will increase six minute walk test distance to >1000 for progression to community ambulator and improve gait ability Baseline: EVAL: To be tested next visit; 10/05/23: 937 ft 5/7:1020 ft Goal status: MET    ASSESSMENT:  CLINICAL IMPRESSION:   Patient has improved step length with use of visual cue of speed ladder. Utilized boxing for UE and LE reciprocal movement training. Pt still freezing at times but balance appears to be improving with each session.  Pt will continue to benefit from skilled physical therapy intervention to address impairments, improve QOL, and attain therapy goals.     OBJECTIVE IMPAIRMENTS: Abnormal gait, decreased balance, decreased coordination, decreased endurance, decreased mobility, difficulty walking, decreased ROM, decreased strength, hypomobility, impaired flexibility, and postural dysfunction.   ACTIVITY LIMITATIONS: carrying, lifting, bending, standing, squatting, stairs, and transfers  PARTICIPATION LIMITATIONS: cleaning, shopping, community activity, and yard work  PERSONAL FACTORS: 1-2 comorbidities: HTN are also affecting patient's functional outcome.   REHAB POTENTIAL: Good  CLINICAL DECISION MAKING: Stable/uncomplicated  EVALUATION COMPLEXITY: Low  PLAN:  PT FREQUENCY: 1-2x/week  PT DURATION: 12 weeks  PLANNED INTERVENTIONS: 97164- PT  Re-evaluation, 97110-Therapeutic exercises, 97530- Therapeutic activity, [id number]- Neuromuscular re-education, 97535- Self Care, 13244- Manual therapy, [id number]- Gait training, [phone]- Orthotic Fit/training, [phone]- Canalith repositioning, [phone]- Electrical stimulation (manual), Patient/Family education, Balance training, Stair training, Taping, Dry Needling, Joint mobilization, Joint manipulation, Spinal manipulation, Spinal mobilization, Vestibular training, DME instructions, Cryotherapy, and Moist heat  PLAN FOR NEXT SESSION:   - continue dynamic gait training addressing freezing when turning; using clock face reference.  - backwards gait training - dual-task challenges and/or motor sequencing challenges could use Blaze Pods - progress HEP as appropriate - Postural strengthening/trunk and LE ROM to assist with flexibility  Edwina Gram PT  Physical Therapist- [facility]   4:19 PM 11/17/23

## 2023-11-22 ENCOUNTER — Ambulatory Visit

## 2023-11-22 DIAGNOSIS — R262 Difficulty in walking, not elsewhere classified: Secondary | ICD-10-CM

## 2023-11-22 DIAGNOSIS — R471 Dysarthria and anarthria: Secondary | ICD-10-CM

## 2023-11-22 DIAGNOSIS — M6281 Muscle weakness (generalized): Secondary | ICD-10-CM

## 2023-11-22 DIAGNOSIS — G20A1 Parkinson's disease without dyskinesia, without mention of fluctuations: Secondary | ICD-10-CM | POA: Diagnosis not present

## 2023-11-22 DIAGNOSIS — R269 Unspecified abnormalities of gait and mobility: Secondary | ICD-10-CM

## 2023-11-22 NOTE — Therapy (Signed)
 OUTPATIENT SPEECH LANGUAGE PATHOLOGY PARKINSON'S TREATMENT   Patient Name: Derrick Bishop MRN: [id number] DOB:March 05, 1946, 78 y.o., male Today's Date: 11/22/2023  PCP: Firman Hughes, MD REFERRING PROVIDER: Devora Folks, MD    End of Session - 11/22/23 1343     Visit Number 15    Number of Visits 24    Date for SLP Re-Evaluation 12/26/23    SLP Start Time 1255    SLP Stop Time  1340    SLP Time Calculation (min) 45 min             Past Medical History:  Diagnosis Date   Hypertension    No past surgical history on file. There are no active problems to display for this patient.   ONSET DATE:  ~3 years ago;   REFERRING DIAG: Parkinson's Disease without dyskinesia, without mention of fluctuations  THERAPY DIAG:  Dysarthria and anarthria  Rationale for Evaluation and Treatment Rehabilitation  SUBJECTIVE:   SUBJECTIVE STATEMENT: Pt alert, pleasant, and cooperative. Pt accompanied by: self  PERTINENT HISTORY: Per Neuro note, 09/15/23, "Parkinson's Disease in a patient with bilateral hand tremor (left > right) + slowness of movement + some changes in balance + hypophonia + micro graphia + hypomimia + minimal bradykinesia on the left + Mild cog wheeling on the right and moderate cog wheeling on the left + decreased arm swing on the left + slight hunched forward posture - symptomatic, without new or worsening features." Pt with hx HTN, HLD, Vit B12 and Vit deficiencies. MRI brain, 12/28/21, "no a cute infarction, hemorrhage, or mass. Mild chronic microvascular ischemic changes."    PAIN:  Are you having pain? No   FALLS: Has patient fallen in last 6 months? Defer to PT  LIVING ENVIRONMENT: Lives with: lives with their spouse Lives in: House/apartment  PLOF:  ambulates with single point cane; driving; indep  PATIENT GOALS  for speech to improve  OBJECTIVE:  TODAY'S TREATMENT:  Reviewed EMST including purpose, how to complete ex's, and maintenance. Pt completed x5  sets of x5 reps of EMST with indep use of hand placement to facilitate adequate lip seal given Bell's Palsy.   Voice ex's completed as follows: Sustained /a/: x10, 85 dB Ascending pitch glides x10, 84 dB Descending pitch glides: x10, 85 db  Pt read x8 functional phrases twice through with loud and intentional voice, averaging 84dB.   During 15 minute speech sample, pt averaged 75 dB with min cues for vocal intensity.  Pt endorsed difficulty maintaining loudness while walking and talking, will target during upcoming sessions.  Pt benefited from use of biofeeback using Voice Analyst app.   Pt educated re: changes to speech/voice due to Parkinson's Disease, voice ex's, EMST, SLP POC, and HEP.  PATIENT EDUCATION: Education details: as above Person educated: Patient  Education method: Programmer, multimedia; Handout Education comprehension: verbalized understanding and needs further education   HOME EXERCISE PROGRAM: Voice Ex's and Functional Reading as outlined above EMST  GOALS: Goals reviewed with patient? Yes  SHORT TERM GOALS: Target date: 10 sessions  Pt will participate in further assessment of functional cognitive-linguistic ability with additional goals to be set as appropriate. Baseline: Goal status: MET  2.  The patient will complete HEP (Maximum duration "ah", High/Lows, and Functional Phrases) at average loudness >/= 80 dB and with loud, good quality voice with min cues.  Baseline: Goal status: MET  3.  Pt will sustain conversational loudness (75-85 dB) for 3-5 minutes of conversation with min cues. Baseline:  Goal  status: MET    LONG TERM GOALS: Target date: 12 weeks  The patient will complete HEP (Maximum duration "ah", High/Lows, and Functional Phrases) at average loudness >/= 80 dB and with loud, good quality voice indep. Baseline: Goal status: PROGRESSING  Pt will sustain conversational loudness (75-85 dB) for 8-10 minutes of conversation with min  cues. Baseline:  Goal status: PROGRESSING   ASSESSMENT:  CLINICAL IMPRESSION: Patient is a 78 y.o. male who was seen today for evaluation in setting of Parkinson's Disease. er Neuro note, 09/15/23, "Parkinson's Disease in a patient with bilateral hand tremor (left > right) + slowness of movement + some changes in balance + hypophonia + micro graphia + hypomimia + minimal bradykinesia on the left + Mild cog wheeling on the right and moderate cog wheeling on the left + decreased arm swing on the left + slight hunched forward posture - symptomatic, without new or worsening features." Pt taking carbidopa-levodopa for Parkinson's Disease with recent increased in dosage.  Pt with hx HTN, HLD, Vit B12 and Vit deficiencies. MRI brain, 12/28/21, "no a cute infarction, hemorrhage, or mass. Mild chronic microvascular ischemic changes." Pt presents with a mild hypokinetic dysarthric c/b hypophonia, mildly hoarse vocal quality and intermittent fast rushes of speech. Evidence of volume decay noted. Wife endorsed that speech is worse in home with pt often "muttering." Wife also identified x2 persons who have stopped communicating with pt due to difficulty hearing him. Pt stimulable to verbal cues and would benefit from course of ST to improve pt's ability to communicate at home and in the community.  OBJECTIVE IMPAIRMENTS include dysarthria. These impairments are limiting patient from effectively communicating at home and in community. Factors affecting potential to achieve goals and functional outcome are n/a. Patient will benefit from skilled SLP services to address above impairments and improve overall function.  REHAB POTENTIAL: Good  PLAN: SLP FREQUENCY: 1-2x/week  SLP DURATION: 12 weeks  PLANNED INTERVENTIONS: Functional tasks, Multimodal communication approach, SLP instruction and feedback, Compensatory strategies, Patient/family education, and further cognitive-linguistic assessment.   Dia Forget,  M.S., CCC-SLP Speech-Language Pathologist [facility] [phone] [place])   [facility] [address] [place], [place], [address] Phone: [phone]   Fax:  [phone]

## 2023-11-24 ENCOUNTER — Ambulatory Visit

## 2023-11-24 DIAGNOSIS — R2681 Unsteadiness on feet: Secondary | ICD-10-CM

## 2023-11-24 DIAGNOSIS — G20A1 Parkinson's disease without dyskinesia, without mention of fluctuations: Secondary | ICD-10-CM | POA: Diagnosis not present

## 2023-11-24 DIAGNOSIS — R262 Difficulty in walking, not elsewhere classified: Secondary | ICD-10-CM

## 2023-11-24 DIAGNOSIS — R269 Unspecified abnormalities of gait and mobility: Secondary | ICD-10-CM

## 2023-11-24 DIAGNOSIS — M6281 Muscle weakness (generalized): Secondary | ICD-10-CM

## 2023-11-24 DIAGNOSIS — R278 Other lack of coordination: Secondary | ICD-10-CM

## 2023-11-24 DIAGNOSIS — R471 Dysarthria and anarthria: Secondary | ICD-10-CM

## 2023-11-24 NOTE — Therapy (Signed)
 OUTPATIENT SPEECH LANGUAGE PATHOLOGY PARKINSON'S TREATMENT   Patient Name: Derrick Bishop MRN: [id number] DOB:11/07/1945, 78 y.o., male Today's Date: 11/24/2023  PCP: Firman Hughes, MD REFERRING PROVIDER: Devora Folks, MD    End of Session - 11/24/23 1048     Visit Number 16    Number of Visits 24    Date for SLP Re-Evaluation 12/26/23    SLP Start Time 1050    SLP Stop Time  1135    SLP Time Calculation (min) 45 min    Activity Tolerance Patient tolerated treatment well             Past Medical History:  Diagnosis Date   Hypertension    No past surgical history on file. There are no active problems to display for this patient.   ONSET DATE:  ~3 years ago;   REFERRING DIAG: Parkinson's Disease without dyskinesia, without mention of fluctuations  THERAPY DIAG:  Dysarthria and anarthria  Rationale for Evaluation and Treatment Rehabilitation  SUBJECTIVE:   SUBJECTIVE STATEMENT: Pt alert, pleasant, and cooperative. Pt accompanied by: self  PERTINENT HISTORY: Per Neuro note, 09/15/23, "Parkinson's Disease in a patient with bilateral hand tremor (left > right) + slowness of movement + some changes in balance + hypophonia + micro graphia + hypomimia + minimal bradykinesia on the left + Mild cog wheeling on the right and moderate cog wheeling on the left + decreased arm swing on the left + slight hunched forward posture - symptomatic, without new or worsening features." Pt with hx HTN, HLD, Vit B12 and Vit deficiencies. MRI brain, 12/28/21, "no a cute infarction, hemorrhage, or mass. Mild chronic microvascular ischemic changes."    PAIN:  Are you having pain? No   FALLS: Has patient fallen in last 6 months? Defer to PT  LIVING ENVIRONMENT: Lives with: lives with their spouse Lives in: House/apartment  PLOF:  ambulates with single point cane; driving; indep  PATIENT GOALS  for speech to improve  OBJECTIVE:  TODAY'S TREATMENT:  Reviewed EMST including purpose,  how to complete ex's, and maintenance. Pt completed x5 sets of x5 reps of EMST with indep use of hand placement to facilitate adequate lip seal given Bell's Palsy.   Voice ex's completed as follows: Sustained /a/: x10, 84 dB Ascending pitch glides x10, 84 dB Descending pitch glides: x10, 85 db  Pt read x8 functional phrases twice through with loud and intentional voice, averaging 84dB.   Pt averaged 74 dB during 10 minute conversation sample. Pt completed "walk and talk" ex's averaging >75 for 5 minutes of walking and talking around the hospital basement.  Pt benefited from use of biofeeback using Voice Analyst app.   Pt educated re: changes to speech/voice due to Parkinson's Disease, voice ex's, EMST, SLP POC, and HEP.  PATIENT EDUCATION: Education details: as above Person educated: Patient  Education method: Programmer, multimedia; Handout Education comprehension: verbalized understanding and needs further education   HOME EXERCISE PROGRAM: Voice Ex's and Functional Reading as outlined above EMST  GOALS: Goals reviewed with patient? Yes  SHORT TERM GOALS: Target date: 10 sessions  Pt will participate in further assessment of functional cognitive-linguistic ability with additional goals to be set as appropriate. Baseline: Goal status: MET  2.  The patient will complete HEP (Maximum duration "ah", High/Lows, and Functional Phrases) at average loudness >/= 80 dB and with loud, good quality voice with min cues.  Baseline: Goal status: MET  3.  Pt will sustain conversational loudness (75-85 dB) for 3-5 minutes of  conversation with min cues. Baseline:  Goal status: MET    LONG TERM GOALS: Target date: 12 weeks  The patient will complete HEP (Maximum duration "ah", High/Lows, and Functional Phrases) at average loudness >/= 80 dB and with loud, good quality voice indep. Baseline: Goal status: PROGRESSING  Pt will sustain conversational loudness (75-85 dB) for 8-10 minutes of  conversation with min cues. Baseline:  Goal status: PROGRESSING   ASSESSMENT:  CLINICAL IMPRESSION: Patient is a 78 y.o. male who was seen today for evaluation in setting of Parkinson's Disease. er Neuro note, 09/15/23, "Parkinson's Disease in a patient with bilateral hand tremor (left > right) + slowness of movement + some changes in balance + hypophonia + micro graphia + hypomimia + minimal bradykinesia on the left + Mild cog wheeling on the right and moderate cog wheeling on the left + decreased arm swing on the left + slight hunched forward posture - symptomatic, without new or worsening features." Pt taking carbidopa-levodopa for Parkinson's Disease with recent increased in dosage.  Pt with hx HTN, HLD, Vit B12 and Vit deficiencies. MRI brain, 12/28/21, "no a cute infarction, hemorrhage, or mass. Mild chronic microvascular ischemic changes." Pt presents with a mild hypokinetic dysarthric c/b hypophonia, mildly hoarse vocal quality and intermittent fast rushes of speech. Evidence of volume decay noted. Wife endorsed that speech is worse in home with pt often "muttering." Wife also identified x2 persons who have stopped communicating with pt due to difficulty hearing him. Pt stimulable to verbal cues and would benefit from course of ST to improve pt's ability to communicate at home and in the community.  OBJECTIVE IMPAIRMENTS include dysarthria. These impairments are limiting patient from effectively communicating at home and in community. Factors affecting potential to achieve goals and functional outcome are n/a. Patient will benefit from skilled SLP services to address above impairments and improve overall function.  REHAB POTENTIAL: Good  PLAN: SLP FREQUENCY: 1-2x/week  SLP DURATION: 12 weeks  PLANNED INTERVENTIONS: Functional tasks, Multimodal communication approach, SLP instruction and feedback, Compensatory strategies, Patient/family education, and further cognitive-linguistic  assessment.   Dia Forget, M.S., CCC-SLP Speech-Language Pathologist [facility] [phone] [place])   [facility] [address] [place], [place], [address] Phone: [phone]   Fax:  [phone]

## 2023-11-24 NOTE — Therapy (Signed)
 OUTPATIENT PHYSICAL THERAPY NEURO TREATMENT    Patient Name: Derrick Bishop MRN: [id number] DOB:15-Dec-1945, 78 y.o., male Today's Date: 11/25/2023   PCP: Dr. Firman Hughes REFERRING PROVIDER: Dr. Devora Folks  END OF SESSION:  PT End of Session - 11/24/23 1055     Visit Number 16    Number of Visits 24    Date for PT Re-Evaluation 12/26/23    Progress Note Due on Visit 20    PT Start Time 1144    PT Stop Time 1226    PT Time Calculation (min) 42 min    Equipment Utilized During Treatment Gait belt    Activity Tolerance Patient tolerated treatment well    Behavior During Therapy WFL for tasks assessed/performed                   Past Medical History:  Diagnosis Date   Hypertension    No past surgical history on file. There are no active problems to display for this patient.   ONSET DATE: 3 years ago  REFERRING DIAG: Parkinson's disease without dyskinesia, without mention of fluctuations   THERAPY DIAG:  Difficulty in walking, not elsewhere classified  Abnormality of gait and mobility  Muscle weakness (generalized)  Unsteadiness on feet  Other lack of coordination  Parkinson's disease without dyskinesia or fluctuating manifestations (HCC)  Rationale for Evaluation and Treatment: Rehabilitation  SUBJECTIVE:                                                                                                                                                                                             SUBJECTIVE STATEMENT:   Patient reports trying to absorb all the knowledge and helpful hints he has learned to help him with Parkinson's and thankful to be moving better.    Pt accompanied by: self  PERTINENT HISTORY:  History obtained by Dr. Mason Sole on 09/15/2023, the below from that note: Assessment & Plan Parkinson's Disease in a patient with bilateral hand tremor (left > right) + slowness of movement + some changes in balance + hypophonia + micro graphia +  hypomimia + minimal bradykinesia on the left + Mild cog wheeling on the right and moderate cog wheeling on the left + decreased arm swing on the left + slight hunched forward posture - symptomatic, without new or worsening features   Parkinson's disease with worsening symptoms, including increased hand tremors, balance problems, and freezing episodes. Current treatment with carbidopa-levodopa three times daily shows no noticeable symptom improvement. Increasing the dose aims to reduce tremors, bradykinesia, and rigidity. The medication is safe with no long-term damage, and higher doses may  be necessary as the disease progresses. Early and adequate dosing can preserve functional years, and the highest tolerable dose should be administered early to maximize functional outcomes. - Increase carbidopa-levodopa to 25-100 mg two times a day  Increase carbidopa-levodopa to 1.5 pills three times daily for two weeks, then increase to 2 pills three times daily. - Order Parkinson's-specific Physical Therapy and Speech Therapy   PAIN:  Are you having pain? No  PRECAUTIONS: Fall  RED FLAGS: None   WEIGHT BEARING RESTRICTIONS: No  FALLS: Has patient fallen in last 6 months? No  LIVING ENVIRONMENT: Lives with: lives with their spouse Lives in: House/apartment Stairs: Yes: External: 3 steps; In process of having railings installed in garage Has following equipment at home: Single point cane  PLOF: Independent  PATIENT GOALS: improve my walking and confidence in getting around.   OBJECTIVE:  Note: Objective measures were completed at Evaluation unless otherwise noted.  DIAGNOSTIC FINDINGS:  Narrative & Impression  CLINICAL DATA:  Neuro deficit, acute, stroke suspected   EXAM: MRI HEAD WITHOUT CONTRAST   TECHNIQUE: Multiplanar, multiecho pulse sequences of the brain and surrounding structures were obtained without intravenous contrast.   COMPARISON:  2012   FINDINGS: Brain: There is no  acute infarction or intracranial hemorrhage. There is no intracranial mass, mass effect, or edema. There is no hydrocephalus or extra-axial fluid collection. Ventricles and sulci are within normal limits in size and configuration. Patchy T2 hyperintensity in the supratentorial white matter is nonspecific but may reflect mild chronic microvascular ischemic changes.   Vascular: Major vessel flow voids at the skull base are preserved.   Skull and upper cervical spine: Normal marrow signal is preserved.   Sinuses/Orbits: Paranasal sinus mucosal thickening. Orbits are unremarkable.   Other: Sella is unremarkable.  Mastoid air cells are clear.   IMPRESSION: No acute infarction, hemorrhage, or mass.   Mild chronic microvascular ischemic changes.     Electronically Signed   By: Geannie Keener M.D.   On: 12/28/2021 13:07      COGNITION: Overall cognitive status: Within functional limits for tasks assessed   SENSATION: WFL  COORDINATION: Slow to initiate at times  EDEMA:  None observed    POSTURE: rounded shoulders and forward head  LOWER EXTREMITY ROM:     Active  Right Eval Left Eval  Hip flexion    Hip extension    Hip abduction    Hip adduction    Hip internal rotation    Hip external rotation    Knee flexion    Knee extension    Ankle dorsiflexion    Ankle plantarflexion    Ankle inversion    Ankle eversion     (Blank rows = not tested)  LOWER EXTREMITY MMT:    MMT Right Eval Left Eval  Hip flexion 4 4  Hip extension 4 4  Hip abduction 4 4  Hip adduction    Hip internal rotation 4 4  Hip external rotation 4 4  Knee flexion 4 4  Knee extension 4 4  Ankle dorsiflexion 4 4  Ankle plantarflexion    Ankle inversion    Ankle eversion    (Blank rows = not tested)  BED MOBILITY:  NOT TESTED- Patient reports independent  TRANSFERS: Assistive device utilized: None  Sit to stand: Complete Independence Stand to sit: Complete Independence Chair  to chair: Complete Independence Floor: Not tested  GAIT: Gait pattern: decreased arm swing- Right, decreased arm swing- Left, decreased step length- Right, decreased step  length- Left, decreased stance time- Right, decreased stance time- Left, decreased stride length, shuffling, festinating, poor foot clearance- Right, and poor foot clearance- Left Distance walked: > 100 feet Assistive device utilized: has cane today but did not use- states only using in unfamiliar community settings Level of assistance: CGA   FUNCTIONAL TESTS:  5 times sit to stand: 18.81 sec without UE support Timed up and go (TUG): 16.03 sec avg without AD 6 minute walk test: 937 ftt 10 meter walk test: 0.79 m/s avg without AD Berg Balance Scale: 43/56  PATIENT SURVEYS:  ABC scale 75.6%                                                                                                                              TREATMENT DATE: 11/25/23    TA- To improve functional movements patterns for everyday tasks   Large amplitude step march on airex pad with initial BUE support x 10; progressed to 1 UE support (light touch) x 15 reps alt LE.   Fwd/Bwd step up/over 1/2 foam roll with 1 UE support x 15 reps alt LE (min difficulty clearing foam with retro step)   Side step up/over 1/2 foam with no UE support x 15 reps each Direction.   Agility ladder-focusing on heel to toe gait sequencing-  x 10 down and back; addition of arm swing focus;     Step tap onto 1st step without UE support x 10 reps alt LE  Step tap onto 2nd step without UE support x 10 reps alt LE   Sit to stand without UE support x 10 reps (VC for technique)   Activity Description: 5 on floor lateral step to tap Activity Setting:  The Blaze Pod Random setting was chosen to enhance cognitive processing and agility, providing an unpredictable environment to simulate real-world scenarios, and fostering quick reactions and adaptability.   Number of Pods:   5 Cycles/Sets:  6 Duration (Time or Hit Count):  45 sec time- measuring hit count-from 6 to  Coordination activity: Sit to stand (w/o UE suppor) then walk 4 feet fwd to dry erase board/support bar- then dynamic reach/tap diagonal overhead to right then side step to left side of board and to left then 180 deg turn back toward chair- walk 4 feet then 180 deg turn then sit- timed - (3 x with time ranging from 25 sec down to 12. 47 Throughout session PT provided CGA for safety and gait belt in place for increased safety.    PATIENT EDUCATION: Education details: large amplitude technique, HEP,  Person educated: Patient Education method: Medical illustrator, VC printout Education comprehension: verbalized understanding, returned demonstration, and verbal cues required  HOME EXERCISE PROGRAM:  Access Code: [email or web address] URL: [email or web address] Date: 10/18/2023 Prepared by: Carlen Chasten  Exercises - Walking  - 1 x daily - 7 x weekly - 1 sets - 1 reps - 6-10 minutes hold - Sit to Stand Without Arm Support  -  1 x daily - 5-6 x weekly - 2-3 sets - 10 reps - Seated Finger Flicks with Elbow Extension  - 1 x daily - 7 x weekly - 2 sets - 20 reps - Seated Reaching to Side and Across Body  - 1 x daily - 7 x weekly - 2 sets - 10 reps  GOALS: Goals reviewed with patient? Yes  SHORT TERM GOALS: Target date: 11/14/2023  Pt will be independent with HEP in order to improve strength and balance in order to decrease fall risk and improve function at home and work. Baseline: 5/7: Every other day, tries to incorporate into everyday tasks  Goal status: ONGOING   LONG TERM GOALS: Target date: 12/26/2023  1.  Patient (> 64 years old) will complete five times sit to stand test in < 15 seconds indicating an increased LE strength and improved balance. Baseline: EVAL= 18.81 sec without UE 11/02/23: 9.26 sec  Goal status: ONGOING  2.  Patient will improve ABC score to >80%   to demonstrate  statistically significant improvement in mobility and quality of life as it relates to their confidence in his balance.  Baseline: EVAL=75.6 % 5/7: 85% Goal status: INITIAL   4.  Patient will increase Berg Balance score by > 6 points to demonstrate decreased fall risk during functional activities. Baseline: EVAL= 43/56 5/7: 47 Goal status: ONGOING   5.   Patient will reduce timed up and go to <11 seconds to reduce fall risk and demonstrate improved transfer/gait ability. Baseline: EVAL= 16.03 without AD 5/7:12.43 sec Goal status: MET   6.   Patient will increase 10 meter walk test to >1.24m/s as to improve gait speed for better community ambulation and to reduce fall risk. Baseline: EVAL= 0.79 m/s avg without AD 5/7:1.02 m/s Goal status: MET  7.   Patient will increase six minute walk test distance to >1000 for progression to community ambulator and improve gait ability Baseline: EVAL: To be tested next visit; 10/05/23: 937 ft 5/7:1020 ft Goal status: MET    ASSESSMENT:  CLINICAL IMPRESSION:   Patient presents with good motivation for today's session. He was challenged with balance activities including blaze pods exhibiting some freezing and difficulty with turning. He did adapt to activity and demonstrate some progress as seen by increased hits and improved reaction time and essentially no real LOB- just unsteady at times with turning.  Pt will continue to benefit from skilled physical therapy intervention to address impairments, improve QOL, and attain therapy goals.     OBJECTIVE IMPAIRMENTS: Abnormal gait, decreased balance, decreased coordination, decreased endurance, decreased mobility, difficulty walking, decreased ROM, decreased strength, hypomobility, impaired flexibility, and postural dysfunction.   ACTIVITY LIMITATIONS: carrying, lifting, bending, standing, squatting, stairs, and transfers  PARTICIPATION LIMITATIONS: cleaning, shopping, community activity, and yard  work  PERSONAL FACTORS: 1-2 comorbidities: HTN are also affecting patient's functional outcome.   REHAB POTENTIAL: Good  CLINICAL DECISION MAKING: Stable/uncomplicated  EVALUATION COMPLEXITY: Low  PLAN:  PT FREQUENCY: 1-2x/week  PT DURATION: 12 weeks  PLANNED INTERVENTIONS: 97164- PT Re-evaluation, 97110-Therapeutic exercises, 97530- Therapeutic activity, [id number]- Neuromuscular re-education, 97535- Self Care, 78469- Manual therapy, [id number]- Gait training, [phone]- Orthotic Fit/training, [phone]- Canalith repositioning, [phone]- Electrical stimulation (manual), Patient/Family education, Balance training, Stair training, Taping, Dry Needling, Joint mobilization, Joint manipulation, Spinal manipulation, Spinal mobilization, Vestibular training, DME instructions, Cryotherapy, and Moist heat  PLAN FOR NEXT SESSION:   - continue dynamic gait training addressing freezing when turning; using clock face reference.  - backwards gait training -  dual-task challenges and/or motor sequencing challenges could use Blaze Pods - progress HEP as appropriate - Postural strengthening/trunk and LE ROM to assist with flexibility  Murlene Army PT  Physical Therapist- [facility]  [facility]   7:25 AM 11/25/23

## 2023-11-28 ENCOUNTER — Ambulatory Visit: Attending: Neurology

## 2023-11-28 ENCOUNTER — Ambulatory Visit: Admitting: Physical Therapy

## 2023-11-28 DIAGNOSIS — M6281 Muscle weakness (generalized): Secondary | ICD-10-CM

## 2023-11-28 DIAGNOSIS — R2681 Unsteadiness on feet: Secondary | ICD-10-CM | POA: Diagnosis present

## 2023-11-28 DIAGNOSIS — G20A1 Parkinson's disease without dyskinesia, without mention of fluctuations: Secondary | ICD-10-CM | POA: Insufficient documentation

## 2023-11-28 DIAGNOSIS — R471 Dysarthria and anarthria: Secondary | ICD-10-CM | POA: Diagnosis present

## 2023-11-28 DIAGNOSIS — R278 Other lack of coordination: Secondary | ICD-10-CM | POA: Insufficient documentation

## 2023-11-28 DIAGNOSIS — R262 Difficulty in walking, not elsewhere classified: Secondary | ICD-10-CM | POA: Diagnosis present

## 2023-11-28 DIAGNOSIS — R269 Unspecified abnormalities of gait and mobility: Secondary | ICD-10-CM | POA: Insufficient documentation

## 2023-11-28 NOTE — Therapy (Signed)
 OUTPATIENT SPEECH LANGUAGE PATHOLOGY PARKINSON'S TREATMENT   Patient Name: Derrick Bishop MRN: [id number] DOB:[birth year], 78 y.o., male Today's Date: 11/28/2023  PCP: Firman Hughes, MD REFERRING PROVIDER: Devora Folks, MD    End of Session - 11/28/23 1621     Visit Number 17    Number of Visits 24    Date for SLP Re-Evaluation 12/26/23    SLP Start Time 1530    SLP Stop Time  1615    SLP Time Calculation (min) 45 min    Activity Tolerance Patient tolerated treatment well             Past Medical History:  Diagnosis Date   Hypertension    No past surgical history on file. There are no active problems to display for this patient.   ONSET DATE:  ~3 years ago;   REFERRING DIAG: Parkinson's Disease without dyskinesia, without mention of fluctuations  THERAPY DIAG:  No diagnosis found.  Rationale for Evaluation and Treatment Rehabilitation  SUBJECTIVE:   SUBJECTIVE STATEMENT: Pt alert, pleasant, and cooperative. Pt accompanied by: self  PERTINENT HISTORY: Per Neuro note, 09/15/23, "Parkinson's Disease in a patient with bilateral hand tremor (left > right) + slowness of movement + some changes in balance + hypophonia + micro graphia + hypomimia + minimal bradykinesia on the left + Mild cog wheeling on the right and moderate cog wheeling on the left + decreased arm swing on the left + slight hunched forward posture - symptomatic, without new or worsening features." Pt with hx HTN, HLD, Vit B12 and Vit deficiencies. MRI brain, 12/28/21, "no a cute infarction, hemorrhage, or mass. Mild chronic microvascular ischemic changes."    PAIN:  Are you having pain? No   FALLS: Has patient fallen in last 6 months? Defer to PT  LIVING ENVIRONMENT: Lives with: lives with their spouse Lives in: House/apartment  PLOF:  ambulates with single point cane; driving; indep  PATIENT GOALS  for speech to improve  OBJECTIVE:  TODAY'S TREATMENT:  Reviewed EMST including purpose, how to  complete ex's, and maintenance. Pt completed x5 sets of x5 reps of EMST with indep use of hand placement to facilitate adequate lip seal given Bell's Palsy.   Voice ex's completed as follows: Sustained /a/: x10, 85-86 dB Ascending pitch glides x10, 84 dB Descending pitch glides: x10, 85 db  Pt read x8 functional phrases twice through with loud and intentional voice, averaging 85 dB.   Pt averaged 75 dB during 10 minute conversation sample.   Pt benefited from use of biofeeback using Voice Analyst app.   Pt educated re: changes to speech/voice due to Parkinson's Disease, voice ex's, EMST, SLP POC, and HEP.  PATIENT EDUCATION: Education details: as above Person educated: Patient  Education method: Programmer, multimedia; Handout Education comprehension: verbalized understanding and needs further education   HOME EXERCISE PROGRAM: Voice Ex's and Functional Reading as outlined above EMST  GOALS: Goals reviewed with patient? Yes  SHORT TERM GOALS: Target date: 10 sessions  Pt will participate in further assessment of functional cognitive-linguistic ability with additional goals to be set as appropriate. Baseline: Goal status: MET  2.  The patient will complete HEP (Maximum duration "ah", High/Lows, and Functional Phrases) at average loudness >/= 80 dB and with loud, good quality voice with min cues.  Baseline: Goal status: MET  3.  Pt will sustain conversational loudness (75-85 dB) for 3-5 minutes of conversation with min cues. Baseline:  Goal status: MET    LONG TERM GOALS: Target date:  12 weeks  The patient will complete HEP (Maximum duration "ah", High/Lows, and Functional Phrases) at average loudness >/= 80 dB and with loud, good quality voice indep. Baseline: Goal status: PROGRESSING  Pt will sustain conversational loudness (75-85 dB) for 8-10 minutes of conversation with min cues. Baseline:  Goal status: PROGRESSING   ASSESSMENT:  CLINICAL IMPRESSION: Patient is a 78  y.o. male who was seen today for evaluation in setting of Parkinson's Disease. er Neuro note, 09/15/23, "Parkinson's Disease in a patient with bilateral hand tremor (left > right) + slowness of movement + some changes in balance + hypophonia + micro graphia + hypomimia + minimal bradykinesia on the left + Mild cog wheeling on the right and moderate cog wheeling on the left + decreased arm swing on the left + slight hunched forward posture - symptomatic, without new or worsening features." Pt taking carbidopa-levodopa for Parkinson's Disease with recent increased in dosage.  Pt with hx HTN, HLD, Vit B12 and Vit deficiencies. MRI brain, 12/28/21, "no a cute infarction, hemorrhage, or mass. Mild chronic microvascular ischemic changes." Pt presents with a mild hypokinetic dysarthric c/b hypophonia, mildly hoarse vocal quality and intermittent fast rushes of speech. Evidence of volume decay noted. Wife endorsed that speech is worse in home with pt often "muttering." Wife also identified x2 persons who have stopped communicating with pt due to difficulty hearing him. Pt stimulable to verbal cues and would benefit from course of ST to improve pt's ability to communicate at home and in the community.  OBJECTIVE IMPAIRMENTS include dysarthria. These impairments are limiting patient from effectively communicating at home and in community. Factors affecting potential to achieve goals and functional outcome are n/a. Patient will benefit from skilled SLP services to address above impairments and improve overall function.  REHAB POTENTIAL: Good  PLAN: SLP FREQUENCY: 1-2x/week  SLP DURATION: 12 weeks  PLANNED INTERVENTIONS: Functional tasks, Multimodal communication approach, SLP instruction and feedback, Compensatory strategies, Patient/family education, and further cognitive-linguistic assessment.   Dia Forget, M.S., CCC-SLP Speech-Language Pathologist [facility] [phone] [place])   [facility] at [facility]  Outpatient Surgery [address] [place], [place], [address] Phone: [phone]   Fax:  [phone]

## 2023-11-28 NOTE — Therapy (Unsigned)
 OUTPATIENT PHYSICAL THERAPY NEURO TREATMENT    Patient Name: Derrick Bishop MRN: [id number] DOB:September 11, 1945, 78 y.o., male Today's Date: 11/28/2023   PCP: Dr. Firman Hughes REFERRING PROVIDER: Dr. Devora Folks  END OF SESSION:  PT End of Session - 11/28/23 1605     Visit Number 17    Number of Visits 24    Date for PT Re-Evaluation 12/26/23    Progress Note Due on Visit 20    PT Start Time 1615    PT Stop Time 1655    PT Time Calculation (min) 40 min    Equipment Utilized During Treatment Gait belt    Activity Tolerance Patient tolerated treatment well    Behavior During Therapy WFL for tasks assessed/performed                   Past Medical History:  Diagnosis Date   Hypertension    No past surgical history on file. There are no active problems to display for this patient.   ONSET DATE: 3 years ago  REFERRING DIAG: Parkinson's disease without dyskinesia, without mention of fluctuations   THERAPY DIAG:  Difficulty in walking, not elsewhere classified  Abnormality of gait and mobility  Muscle weakness (generalized)  Unsteadiness on feet  Other lack of coordination  Parkinson's disease without dyskinesia or fluctuating manifestations (HCC)  Rationale for Evaluation and Treatment: Rehabilitation  SUBJECTIVE:                                                                                                                                                                                             SUBJECTIVE STATEMENT:   Pt reports that he is doing well. No pain reported on this day and no falls at home.    Pt accompanied by: self  PERTINENT HISTORY:  History obtained by Dr. Mason Sole on 09/15/2023, the below from that note: Assessment & Plan Parkinson's Disease in a patient with bilateral hand tremor (left > right) + slowness of movement + some changes in balance + hypophonia + micro graphia + hypomimia + minimal bradykinesia on the left + Mild cog wheeling  on the right and moderate cog wheeling on the left + decreased arm swing on the left + slight hunched forward posture - symptomatic, without new or worsening features   Parkinson's disease with worsening symptoms, including increased hand tremors, balance problems, and freezing episodes. Current treatment with carbidopa-levodopa three times daily shows no noticeable symptom improvement. Increasing the dose aims to reduce tremors, bradykinesia, and rigidity. The medication is safe with no long-term damage, and higher doses may be necessary as the disease progresses. Early  and adequate dosing can preserve functional years, and the highest tolerable dose should be administered early to maximize functional outcomes. - Increase carbidopa-levodopa to 25-100 mg two times a day  Increase carbidopa-levodopa to 1.5 pills three times daily for two weeks, then increase to 2 pills three times daily. - Order Parkinson's-specific Physical Therapy and Speech Therapy   PAIN:  Are you having pain? No  PRECAUTIONS: Fall  RED FLAGS: None   WEIGHT BEARING RESTRICTIONS: No  FALLS: Has patient fallen in last 6 months? No  LIVING ENVIRONMENT: Lives with: lives with their spouse Lives in: House/apartment Stairs: Yes: External: 3 steps; In process of having railings installed in garage Has following equipment at home: Single point cane  PLOF: Independent  PATIENT GOALS: improve my walking and confidence in getting around.   OBJECTIVE:  Note: Objective measures were completed at Evaluation unless otherwise noted.  DIAGNOSTIC FINDINGS:  Narrative & Impression  CLINICAL DATA:  Neuro deficit, acute, stroke suspected   EXAM: MRI HEAD WITHOUT CONTRAST   TECHNIQUE: Multiplanar, multiecho pulse sequences of the brain and surrounding structures were obtained without intravenous contrast.   COMPARISON:  2012   FINDINGS: Brain: There is no acute infarction or intracranial hemorrhage. There is no  intracranial mass, mass effect, or edema. There is no hydrocephalus or extra-axial fluid collection. Ventricles and sulci are within normal limits in size and configuration. Patchy T2 hyperintensity in the supratentorial white matter is nonspecific but may reflect mild chronic microvascular ischemic changes.   Vascular: Major vessel flow voids at the skull base are preserved.   Skull and upper cervical spine: Normal marrow signal is preserved.   Sinuses/Orbits: Paranasal sinus mucosal thickening. Orbits are unremarkable.   Other: Sella is unremarkable.  Mastoid air cells are clear.   IMPRESSION: No acute infarction, hemorrhage, or mass.   Mild chronic microvascular ischemic changes.     Electronically Signed   By: Geannie Keener M.D.   On: 12/28/2021 13:07      COGNITION: Overall cognitive status: Within functional limits for tasks assessed   SENSATION: WFL  COORDINATION: Slow to initiate at times  EDEMA:  None observed    POSTURE: rounded shoulders and forward head  LOWER EXTREMITY ROM:     Active  Right Eval Left Eval  Hip flexion    Hip extension    Hip abduction    Hip adduction    Hip internal rotation    Hip external rotation    Knee flexion    Knee extension    Ankle dorsiflexion    Ankle plantarflexion    Ankle inversion    Ankle eversion     (Blank rows = not tested)  LOWER EXTREMITY MMT:    MMT Right Eval Left Eval  Hip flexion 4 4  Hip extension 4 4  Hip abduction 4 4  Hip adduction    Hip internal rotation 4 4  Hip external rotation 4 4  Knee flexion 4 4  Knee extension 4 4  Ankle dorsiflexion 4 4  Ankle plantarflexion    Ankle inversion    Ankle eversion    (Blank rows = not tested)  BED MOBILITY:  NOT TESTED- Patient reports independent  TRANSFERS: Assistive device utilized: None  Sit to stand: Complete Independence Stand to sit: Complete Independence Chair to chair: Complete Independence Floor: Not  tested  GAIT: Gait pattern: decreased arm swing- Right, decreased arm swing- Left, decreased step length- Right, decreased step length- Left, decreased stance time- Right, decreased  stance time- Left, decreased stride length, shuffling, festinating, poor foot clearance- Right, and poor foot clearance- Left Distance walked: > 100 feet Assistive device utilized: has cane today but did not use- states only using in unfamiliar community settings Level of assistance: CGA   FUNCTIONAL TESTS:  5 times sit to stand: 18.81 sec without UE support Timed up and go (TUG): 16.03 sec avg without AD 6 minute walk test: 937 ftt 10 meter walk test: 0.79 m/s avg without AD Berg Balance Scale: 43/56  PATIENT SURVEYS:  ABC scale 75.6%                                                                                                                              TREATMENT DATE: 11/28/23    TA- To improve functional movements patterns for everyday tasks   Octane level 3 leg press x 8 min cues for for sustained SPM as tolerated through increased resistance.   In parallel bars forward step over bolster with reciprocal pattern x 8  Reverse step over 2 bolsters with alternating step to x 4 bouts and reciprocal pattern x 4 bouts.  Side stepping over 2 bolsters x 8 bil.   PT applied 3# ankle weights.  Functional gait training through various environments to improve safety with community ambulation through hall of hospital 2 x 575ft, through cafeteria 175ft x2 across unlevel cement sidewalk including ramp descnet and ascent x 611ft total. Gait through grass x 89ft. No UE support throughout and GCA provided by PT for safety. Improved step height and heel contact noted through grass than on other surfaces. 2 seated rest breaks provided due to BLE fatigue.  Throughout session PT provided CGA for safety and gait belt in place for increased safety.    PATIENT EDUCATION: Education details: large amplitude technique, HEP,   Person educated: Patient Education method: Medical illustrator, VC printout Education comprehension: verbalized understanding, returned demonstration, and verbal cues required  HOME EXERCISE PROGRAM:  Access Code: [email or web address] URL: [email or web address] Date: 10/18/2023 Prepared by: Carlen Chasten  Exercises - Walking  - 1 x daily - 7 x weekly - 1 sets - 1 reps - 6-10 minutes hold - Sit to Stand Without Arm Support  - 1 x daily - 5-6 x weekly - 2-3 sets - 10 reps - Seated Finger Flicks with Elbow Extension  - 1 x daily - 7 x weekly - 2 sets - 20 reps - Seated Reaching to Side and Across Body  - 1 x daily - 7 x weekly - 2 sets - 10 reps  GOALS: Goals reviewed with patient? Yes  SHORT TERM GOALS: Target date: 11/14/2023  Pt will be independent with HEP in order to improve strength and balance in order to decrease fall risk and improve function at home and work. Baseline: 5/7: Every other day, tries to incorporate into everyday tasks  Goal status: ONGOING   LONG TERM GOALS: Target date: 12/26/2023  1.  Patient (>  64 years old) will complete five times sit to stand test in < 15 seconds indicating an increased LE strength and improved balance. Baseline: EVAL= 18.81 sec without UE 11/02/23: 9.26 sec  Goal status: ONGOING  2.  Patient will improve ABC score to >80%   to demonstrate statistically significant improvement in mobility and quality of life as it relates to their confidence in his balance.  Baseline: EVAL=75.6 % 5/7: 85% Goal status: INITIAL   4.  Patient will increase Berg Balance score by > 6 points to demonstrate decreased fall risk during functional activities. Baseline: EVAL= 43/56 5/7: 47 Goal status: ONGOING   5.   Patient will reduce timed up and go to <11 seconds to reduce fall risk and demonstrate improved transfer/gait ability. Baseline: EVAL= 16.03 without AD 5/7:12.43 sec Goal status: MET   6.   Patient will increase 10 meter walk test to  >1.46m/s as to improve gait speed for better community ambulation and to reduce fall risk. Baseline: EVAL= 0.79 m/s avg without AD 5/7:1.02 m/s Goal status: MET  7.   Patient will increase six minute walk test distance to >1000 for progression to community ambulator and improve gait ability Baseline: EVAL: To be tested next visit; 10/05/23: 937 ft 5/7:1020 ft Goal status: MET    ASSESSMENT:  CLINICAL IMPRESSION:   Patient presents with good motivation for today's session. He was challenged with functional movement training over obstacles and through community mobility with BLE weighted resistance to force increased muscle activation and amplitude on various surfaces. Remained attentive to pt needs and rest breaks provided as required by Pt.  Pt will continue to benefit from skilled physical therapy intervention to address impairments, improve QOL, and attain therapy goals.     OBJECTIVE IMPAIRMENTS: Abnormal gait, decreased balance, decreased coordination, decreased endurance, decreased mobility, difficulty walking, decreased ROM, decreased strength, hypomobility, impaired flexibility, and postural dysfunction.   ACTIVITY LIMITATIONS: carrying, lifting, bending, standing, squatting, stairs, and transfers  PARTICIPATION LIMITATIONS: cleaning, shopping, community activity, and yard work  PERSONAL FACTORS: 1-2 comorbidities: HTN are also affecting patient's functional outcome.   REHAB POTENTIAL: Good  CLINICAL DECISION MAKING: Stable/uncomplicated  EVALUATION COMPLEXITY: Low  PLAN:  PT FREQUENCY: 1-2x/week  PT DURATION: 12 weeks  PLANNED INTERVENTIONS: 97164- PT Re-evaluation, 97110-Therapeutic exercises, 97530- Therapeutic activity, [id number]- Neuromuscular re-education, 97535- Self Care, 40981- Manual therapy, [id number]- Gait training, [phone]- Orthotic Fit/training, [phone]- Canalith repositioning, [phone]- Electrical stimulation (manual), Patient/Family education, Balance training, Stair  training, Taping, Dry Needling, Joint mobilization, Joint manipulation, Spinal manipulation, Spinal mobilization, Vestibular training, DME instructions, Cryotherapy, and Moist heat  PLAN FOR NEXT SESSION:   - continue dynamic gait training addressing freezing when turning; using clock face reference.  - backwards gait training - dual-task challenges and/or motor sequencing challenges could use Blaze Pods - progress HEP as appropriate - Postural strengthening/trunk and LE ROM to assist with flexibility  Barbara Book PT  Physical Therapist- [facility]   4:15 PM 11/28/23

## 2023-11-30 ENCOUNTER — Ambulatory Visit

## 2023-11-30 ENCOUNTER — Ambulatory Visit: Admitting: Physical Therapy

## 2023-11-30 DIAGNOSIS — R2681 Unsteadiness on feet: Secondary | ICD-10-CM

## 2023-11-30 DIAGNOSIS — R262 Difficulty in walking, not elsewhere classified: Secondary | ICD-10-CM

## 2023-11-30 DIAGNOSIS — R278 Other lack of coordination: Secondary | ICD-10-CM

## 2023-11-30 DIAGNOSIS — R269 Unspecified abnormalities of gait and mobility: Secondary | ICD-10-CM

## 2023-11-30 DIAGNOSIS — R471 Dysarthria and anarthria: Secondary | ICD-10-CM

## 2023-11-30 DIAGNOSIS — M6281 Muscle weakness (generalized): Secondary | ICD-10-CM

## 2023-11-30 NOTE — Therapy (Unsigned)
 OUTPATIENT PHYSICAL THERAPY NEURO TREATMENT    Patient Name: Derrick Bishop MRN: [id number] DOB:[birth year], 78 y.o., male Today's Date: 11/30/2023   PCP: Dr. Firman Hughes REFERRING PROVIDER: Dr. Devora Folks  END OF SESSION:  PT End of Session - 11/30/23 1635     Visit Number 18    Number of Visits 24    Date for PT Re-Evaluation 12/26/23    Progress Note Due on Visit 20    PT Start Time 1620    PT Stop Time 1700    PT Time Calculation (min) 40 min    Equipment Utilized During Treatment Gait belt    Activity Tolerance Patient tolerated treatment well    Behavior During Therapy WFL for tasks assessed/performed                   Past Medical History:  Diagnosis Date   Hypertension    No past surgical history on file. There are no active problems to display for this patient.   ONSET DATE: 3 years ago  REFERRING DIAG: Parkinson's disease without dyskinesia, without mention of fluctuations   THERAPY DIAG:  Difficulty in walking, not elsewhere classified  Abnormality of gait and mobility  Muscle weakness (generalized)  Unsteadiness on feet  Other lack of coordination  Rationale for Evaluation and Treatment: Rehabilitation  SUBJECTIVE:                                                                                                                                                                                             SUBJECTIVE STATEMENT:   Pt reports that he is doing well. No pain reported on this day and no falls at home.    Pt accompanied by: self  PERTINENT HISTORY:  History obtained by Dr. Mason Sole on 09/15/2023, the below from that note: Assessment & Plan Parkinson's Disease in a patient with bilateral hand tremor (left > right) + slowness of movement + some changes in balance + hypophonia + micro graphia + hypomimia + minimal bradykinesia on the left + Mild cog wheeling on the right and moderate cog wheeling on the left + decreased arm swing on  the left + slight hunched forward posture - symptomatic, without new or worsening features   Parkinson's disease with worsening symptoms, including increased hand tremors, balance problems, and freezing episodes. Current treatment with carbidopa-levodopa three times daily shows no noticeable symptom improvement. Increasing the dose aims to reduce tremors, bradykinesia, and rigidity. The medication is safe with no long-term damage, and higher doses may be necessary as the disease progresses. Early and adequate dosing can preserve functional years, and the  highest tolerable dose should be administered early to maximize functional outcomes. - Increase carbidopa-levodopa to 25-100 mg two times a day  Increase carbidopa-levodopa to 1.5 pills three times daily for two weeks, then increase to 2 pills three times daily. - Order Parkinson's-specific Physical Therapy and Speech Therapy   PAIN:  Are you having pain? No  PRECAUTIONS: Fall  RED FLAGS: None   WEIGHT BEARING RESTRICTIONS: No  FALLS: Has patient fallen in last 6 months? No  LIVING ENVIRONMENT: Lives with: lives with their spouse Lives in: House/apartment Stairs: Yes: External: 3 steps; In process of having railings installed in garage Has following equipment at home: Single point cane  PLOF: Independent  PATIENT GOALS: improve my walking and confidence in getting around.   OBJECTIVE:  Note: Objective measures were completed at Evaluation unless otherwise noted.  DIAGNOSTIC FINDINGS:  Narrative & Impression  CLINICAL DATA:  Neuro deficit, acute, stroke suspected   EXAM: MRI HEAD WITHOUT CONTRAST   TECHNIQUE: Multiplanar, multiecho pulse sequences of the brain and surrounding structures were obtained without intravenous contrast.   COMPARISON:  2012   FINDINGS: Brain: There is no acute infarction or intracranial hemorrhage. There is no intracranial mass, mass effect, or edema. There is no hydrocephalus or extra-axial  fluid collection. Ventricles and sulci are within normal limits in size and configuration. Patchy T2 hyperintensity in the supratentorial white matter is nonspecific but may reflect mild chronic microvascular ischemic changes.   Vascular: Major vessel flow voids at the skull base are preserved.   Skull and upper cervical spine: Normal marrow signal is preserved.   Sinuses/Orbits: Paranasal sinus mucosal thickening. Orbits are unremarkable.   Other: Sella is unremarkable.  Mastoid air cells are clear.   IMPRESSION: No acute infarction, hemorrhage, or mass.   Mild chronic microvascular ischemic changes.     Electronically Signed   By: Geannie Keener M.D.   On: 12/28/2021 13:07      COGNITION: Overall cognitive status: Within functional limits for tasks assessed   SENSATION: WFL  COORDINATION: Slow to initiate at times  EDEMA:  None observed    POSTURE: rounded shoulders and forward head  LOWER EXTREMITY ROM:     Active  Right Eval Left Eval  Hip flexion    Hip extension    Hip abduction    Hip adduction    Hip internal rotation    Hip external rotation    Knee flexion    Knee extension    Ankle dorsiflexion    Ankle plantarflexion    Ankle inversion    Ankle eversion     (Blank rows = not tested)  LOWER EXTREMITY MMT:    MMT Right Eval Left Eval  Hip flexion 4 4  Hip extension 4 4  Hip abduction 4 4  Hip adduction    Hip internal rotation 4 4  Hip external rotation 4 4  Knee flexion 4 4  Knee extension 4 4  Ankle dorsiflexion 4 4  Ankle plantarflexion    Ankle inversion    Ankle eversion    (Blank rows = not tested)  BED MOBILITY:  NOT TESTED- Patient reports independent  TRANSFERS: Assistive device utilized: None  Sit to stand: Complete Independence Stand to sit: Complete Independence Chair to chair: Complete Independence Floor: Not tested  GAIT: Gait pattern: decreased arm swing- Right, decreased arm swing- Left, decreased  step length- Right, decreased step length- Left, decreased stance time- Right, decreased stance time- Left, decreased stride length, shuffling, festinating, poor  foot clearance- Right, and poor foot clearance- Left Distance walked: > 100 feet Assistive device utilized: has cane today but did not use- states only using in unfamiliar community settings Level of assistance: CGA   FUNCTIONAL TESTS:  5 times sit to stand: 18.81 sec without UE support Timed up and go (TUG): 16.03 sec avg without AD 6 minute walk test: 937 ftt 10 meter walk test: 0.79 m/s avg without AD Berg Balance Scale: 43/56  PATIENT SURVEYS:  ABC scale 75.6%                                                                                                                              TREATMENT DATE: 11/30/23    TA- To improve functional movements patterns for everyday tasks   Octane level 3 leg press x 7 min cues for for sustained SPM as tolerated through increased resistance.   In parallel bars 1 foot on airex pad, 1 foot on 6inch stpe. 2 x 20 sec bil  Reciprocal forward step to 6inch step from pad. X 15 bil   Lateral reciprocal step from airex pad x 15   Stair reciprocally  Gait without AD.   Throughout session PT provided CGA for safety and gait belt in place for increased safety.    PATIENT EDUCATION: Education details: large amplitude technique, HEP,  Person educated: Patient Education method: Medical illustrator, VC printout Education comprehension: verbalized understanding, returned demonstration, and verbal cues required  HOME EXERCISE PROGRAM:  Access Code: [email or web address] URL: [email or web address] Date: 10/18/2023 Prepared by: Carlen Chasten  Exercises - Walking  - 1 x daily - 7 x weekly - 1 sets - 1 reps - 6-10 minutes hold - Sit to Stand Without Arm Support  - 1 x daily - 5-6 x weekly - 2-3 sets - 10 reps - Seated Finger Flicks with Elbow Extension  - 1 x daily - 7 x weekly - 2  sets - 20 reps - Seated Reaching to Side and Across Body  - 1 x daily - 7 x weekly - 2 sets - 10 reps  GOALS: Goals reviewed with patient? Yes  SHORT TERM GOALS: Target date: 11/14/2023  Pt will be independent with HEP in order to improve strength and balance in order to decrease fall risk and improve function at home and work. Baseline: 5/7: Every other day, tries to incorporate into everyday tasks  Goal status: ONGOING   LONG TERM GOALS: Target date: 12/26/2023  1.  Patient (> 39 years old) will complete five times sit to stand test in < 15 seconds indicating an increased LE strength and improved balance. Baseline: EVAL= 18.81 sec without UE 11/02/23: 9.26 sec  Goal status: ONGOING  2.  Patient will improve ABC score to >80%   to demonstrate statistically significant improvement in mobility and quality of life as it relates to their confidence in his balance.  Baseline: EVAL=75.6 % 5/7: 85% Goal status: INITIAL   4.  Patient will increase Berg Balance score by > 6 points to demonstrate decreased fall risk during functional activities. Baseline: EVAL= 43/56 5/7: 47 Goal status: ONGOING   5.   Patient will reduce timed up and go to <11 seconds to reduce fall risk and demonstrate improved transfer/gait ability. Baseline: EVAL= 16.03 without AD 5/7:12.43 sec Goal status: MET   6.   Patient will increase 10 meter walk test to >1.76m/s as to improve gait speed for better community ambulation and to reduce fall risk. Baseline: EVAL= 0.79 m/s avg without AD 5/7:1.02 m/s Goal status: MET  7.   Patient will increase six minute walk test distance to >1000 for progression to community ambulator and improve gait ability Baseline: EVAL: To be tested next visit; 10/05/23: 937 ft 5/7:1020 ft Goal status: MET    ASSESSMENT:  CLINICAL IMPRESSION:   Patient presents with good motivation for today's session. He was challenged with functional movement training over obstacles and through  community mobility with BLE weighted resistance to force increased muscle activation and amplitude on various surfaces. Remained attentive to pt needs and rest breaks provided as required by Pt.  Pt will continue to benefit from skilled physical therapy intervention to address impairments, improve QOL, and attain therapy goals.     OBJECTIVE IMPAIRMENTS: Abnormal gait, decreased balance, decreased coordination, decreased endurance, decreased mobility, difficulty walking, decreased ROM, decreased strength, hypomobility, impaired flexibility, and postural dysfunction.   ACTIVITY LIMITATIONS: carrying, lifting, bending, standing, squatting, stairs, and transfers  PARTICIPATION LIMITATIONS: cleaning, shopping, community activity, and yard work  PERSONAL FACTORS: 1-2 comorbidities: HTN are also affecting patient's functional outcome.   REHAB POTENTIAL: Good  CLINICAL DECISION MAKING: Stable/uncomplicated  EVALUATION COMPLEXITY: Low  PLAN:  PT FREQUENCY: 1-2x/week  PT DURATION: 12 weeks  PLANNED INTERVENTIONS: 97164- PT Re-evaluation, 97110-Therapeutic exercises, 97530- Therapeutic activity, [id number]- Neuromuscular re-education, 97535- Self Care, 16109- Manual therapy, [id number]- Gait training, [phone]- Orthotic Fit/training, [phone]- Canalith repositioning, [phone]- Electrical stimulation (manual), Patient/Family education, Balance training, Stair training, Taping, Dry Needling, Joint mobilization, Joint manipulation, Spinal manipulation, Spinal mobilization, Vestibular training, DME instructions, Cryotherapy, and Moist heat  PLAN FOR NEXT SESSION:   - continue dynamic gait training addressing freezing when turning; using clock face reference.  - backwards gait training - dual-task challenges and/or motor sequencing challenges could use Blaze Pods - progress HEP as appropriate - Postural strengthening/trunk and LE ROM to assist with flexibility  Barbara Book PT  Physical Therapist- [facility]   4:42 PM 11/30/23

## 2023-11-30 NOTE — Therapy (Signed)
 OUTPATIENT SPEECH LANGUAGE PATHOLOGY PARKINSON'S TREATMENT   Patient Name: Derrick Bishop MRN: [id number] DOB:09-25-1945, 78 y.o., male Today's Date: 11/30/2023  PCP: Firman Hughes, MD REFERRING PROVIDER: Devora Folks, MD    End of Session - 11/30/23 1504     Visit Number 18    Number of Visits 24    Date for SLP Re-Evaluation 12/26/23    SLP Start Time 1505    SLP Stop Time  1550    SLP Time Calculation (min) 45 min    Activity Tolerance Patient tolerated treatment well             Past Medical History:  Diagnosis Date   Hypertension    No past surgical history on file. There are no active problems to display for this patient.   ONSET DATE:  ~3 years ago;   REFERRING DIAG: Parkinson's Disease without dyskinesia, without mention of fluctuations  THERAPY DIAG:  Dysarthria and anarthria  Rationale for Evaluation and Treatment Rehabilitation  SUBJECTIVE:   SUBJECTIVE STATEMENT: Pt alert, pleasant, and cooperative. Pt accompanied by: self  PERTINENT HISTORY: Per Neuro note, 09/15/23, "Parkinson's Disease in a patient with bilateral hand tremor (left > right) + slowness of movement + some changes in balance + hypophonia + micro graphia + hypomimia + minimal bradykinesia on the left + Mild cog wheeling on the right and moderate cog wheeling on the left + decreased arm swing on the left + slight hunched forward posture - symptomatic, without new or worsening features." Pt with hx HTN, HLD, Vit B12 and Vit deficiencies. MRI brain, 12/28/21, "no a cute infarction, hemorrhage, or mass. Mild chronic microvascular ischemic changes."    PAIN:  Are you having pain? No   FALLS: Has patient fallen in last 6 months? Defer to PT  LIVING ENVIRONMENT: Lives with: lives with their spouse Lives in: House/apartment  PLOF:  ambulates with single point cane; driving; indep  PATIENT GOALS  for speech to improve  OBJECTIVE:  TODAY'S TREATMENT:  Reviewed EMST including purpose,  how to complete ex's, and maintenance. Pt completed x5 sets of x5 reps of EMST with indep use of hand placement to facilitate adequate lip seal given Bell's Palsy.   Voice ex's completed as follows: Sustained /a/: x10, 85-86 dB Ascending pitch glides x10, 84 dB Descending pitch glides: x10, 85 db  Pt read x8 functional phrases twice through with loud and intentional voice, averaging 85 dB.   Pt averaged 74 dB during 10 minute conversation sample; pt able to improve to >75dB with cueing.   Pt benefited from use of biofeeback using Voice Analyst app.   Pt educated re: changes to speech/voice due to Parkinson's Disease, voice ex's, EMST, SLP POC, and HEP.  PATIENT EDUCATION: Education details: as above Person educated: Patient  Education method: Programmer, multimedia; Handout Education comprehension: verbalized understanding and needs further education   HOME EXERCISE PROGRAM: Voice Ex's and Functional Reading as outlined above EMST  GOALS: Goals reviewed with patient? Yes  SHORT TERM GOALS: Target date: 10 sessions  Pt will participate in further assessment of functional cognitive-linguistic ability with additional goals to be set as appropriate. Baseline: Goal status: MET  2.  The patient will complete HEP (Maximum duration "ah", High/Lows, and Functional Phrases) at average loudness >/= 80 dB and with loud, good quality voice with min cues.  Baseline: Goal status: MET  3.  Pt will sustain conversational loudness (75-85 dB) for 3-5 minutes of conversation with min cues. Baseline:  Goal status: MET  LONG TERM GOALS: Target date: 12 weeks  The patient will complete HEP (Maximum duration "ah", High/Lows, and Functional Phrases) at average loudness >/= 80 dB and with loud, good quality voice indep. Baseline: Goal status: PROGRESSING  Pt will sustain conversational loudness (75-85 dB) for 8-10 minutes of conversation with min cues. Baseline:  Goal status:  PROGRESSING   ASSESSMENT:  CLINICAL IMPRESSION: Patient is a 78 y.o. male who was seen today for evaluation in setting of Parkinson's Disease. er Neuro note, 09/15/23, "Parkinson's Disease in a patient with bilateral hand tremor (left > right) + slowness of movement + some changes in balance + hypophonia + micro graphia + hypomimia + minimal bradykinesia on the left + Mild cog wheeling on the right and moderate cog wheeling on the left + decreased arm swing on the left + slight hunched forward posture - symptomatic, without new or worsening features." Pt taking carbidopa-levodopa for Parkinson's Disease with recent increased in dosage.  Pt with hx HTN, HLD, Vit B12 and Vit deficiencies. MRI brain, 12/28/21, "no a cute infarction, hemorrhage, or mass. Mild chronic microvascular ischemic changes." Pt presents with a mild hypokinetic dysarthric c/b hypophonia, mildly hoarse vocal quality and intermittent fast rushes of speech. Evidence of volume decay noted. Wife endorsed that speech is worse in home with pt often "muttering." Wife also identified x2 persons who have stopped communicating with pt due to difficulty hearing him. Pt stimulable to verbal cues and would benefit from course of ST to improve pt's ability to communicate at home and in the community.  OBJECTIVE IMPAIRMENTS include dysarthria. These impairments are limiting patient from effectively communicating at home and in community. Factors affecting potential to achieve goals and functional outcome are n/a. Patient will benefit from skilled SLP services to address above impairments and improve overall function.  REHAB POTENTIAL: Good  PLAN: SLP FREQUENCY: 1-2x/week  SLP DURATION: 12 weeks  PLANNED INTERVENTIONS: Functional tasks, Multimodal communication approach, SLP instruction and feedback, Compensatory strategies, Patient/family education, and further cognitive-linguistic assessment.   Dia Forget, M.S., CCC-SLP Speech-Language  Pathologist [facility] [phone] [place]man)   [place] [facility] Outpatient Rehabilitation at [facility] [address] [place], [place], [address] Phone: [phone]   Fax:  [phone]

## 2023-12-05 ENCOUNTER — Ambulatory Visit: Admitting: Physical Therapy

## 2023-12-05 ENCOUNTER — Ambulatory Visit

## 2023-12-05 DIAGNOSIS — R262 Difficulty in walking, not elsewhere classified: Secondary | ICD-10-CM

## 2023-12-05 DIAGNOSIS — R471 Dysarthria and anarthria: Secondary | ICD-10-CM | POA: Diagnosis not present

## 2023-12-05 DIAGNOSIS — R278 Other lack of coordination: Secondary | ICD-10-CM

## 2023-12-05 DIAGNOSIS — G20A1 Parkinson's disease without dyskinesia, without mention of fluctuations: Secondary | ICD-10-CM

## 2023-12-05 DIAGNOSIS — R2681 Unsteadiness on feet: Secondary | ICD-10-CM

## 2023-12-05 DIAGNOSIS — R269 Unspecified abnormalities of gait and mobility: Secondary | ICD-10-CM

## 2023-12-05 DIAGNOSIS — M6281 Muscle weakness (generalized): Secondary | ICD-10-CM

## 2023-12-05 NOTE — Therapy (Signed)
 OUTPATIENT SPEECH LANGUAGE PATHOLOGY PARKINSON'S TREATMENT   Patient Name: Derrick Bishop MRN: [id number] DOB:[birth year], 78 y.o., male Today's Date: 12/05/2023  PCP: Firman Hughes, MD REFERRING PROVIDER: Devora Folks, MD    End of Session - 12/05/23 1455     Visit Number 19    Number of Visits 24    Date for SLP Re-Evaluation 12/26/23    SLP Start Time 1500    SLP Stop Time  1545    SLP Time Calculation (min) 45 min    Activity Tolerance Patient tolerated treatment well             Past Medical History:  Diagnosis Date   Hypertension    No past surgical history on file. There are no active problems to display for this patient.   ONSET DATE:  ~3 years ago;   REFERRING DIAG: Parkinson's Disease without dyskinesia, without mention of fluctuations  THERAPY DIAG:  Dysarthria and anarthria  Rationale for Evaluation and Treatment Rehabilitation  SUBJECTIVE:   SUBJECTIVE STATEMENT: Pt alert, pleasant, and cooperative. Pt accompanied by: self  PERTINENT HISTORY: Per Neuro note, 09/15/23, "Parkinson's Disease in a patient with bilateral hand tremor (left > right) + slowness of movement + some changes in balance + hypophonia + micro graphia + hypomimia + minimal bradykinesia on the left + Mild cog wheeling on the right and moderate cog wheeling on the left + decreased arm swing on the left + slight hunched forward posture - symptomatic, without new or worsening features." Pt with hx HTN, HLD, Vit B12 and Vit deficiencies. MRI brain, 12/28/21, "no a cute infarction, hemorrhage, or mass. Mild chronic microvascular ischemic changes."    PAIN:  Are you having pain? No   FALLS: Has patient fallen in last 6 months? Defer to PT  LIVING ENVIRONMENT: Lives with: lives with their spouse Lives in: House/apartment  PLOF:  ambulates with single point cane; driving; indep  PATIENT GOALS  for speech to improve  OBJECTIVE:  TODAY'S TREATMENT:  Reviewed EMST including purpose,  how to complete ex's, and maintenance. Pt completed x5 sets of x5 reps of EMST with indep use of hand placement to facilitate adequate lip seal given Bell's Palsy.   Voice ex's completed as follows: Sustained /a/: x10, 85-86 dB Ascending pitch glides x10, 84 dB Descending pitch glides: x10, 85 db  Pt read x8 functional phrases twice through with loud and intentional voice, averaging 84 dB.   Pt averaged 73 dB during 10 minute conversation sample; pt able to improve to >75dB with cueing.   Pt benefited from use of biofeeback using Voice Analyst app.   Pt educated re: changes to speech/voice due to Parkinson's Disease, voice ex's, EMST, SLP POC, and HEP.  PATIENT EDUCATION: Education details: as above Person educated: Patient  Education method: Programmer, multimedia; Handout Education comprehension: verbalized understanding and needs further education   HOME EXERCISE PROGRAM: Voice Ex's and Functional Reading as outlined above EMST  GOALS: Goals reviewed with patient? Yes  SHORT TERM GOALS: Target date: 10 sessions  Pt will participate in further assessment of functional cognitive-linguistic ability with additional goals to be set as appropriate. Baseline: Goal status: MET  2.  The patient will complete HEP (Maximum duration "ah", High/Lows, and Functional Phrases) at average loudness >/= 80 dB and with loud, good quality voice with min cues.  Baseline: Goal status: MET  3.  Pt will sustain conversational loudness (75-85 dB) for 3-5 minutes of conversation with min cues. Baseline:  Goal status: MET  LONG TERM GOALS: Target date: 12 weeks  The patient will complete HEP (Maximum duration "ah", High/Lows, and Functional Phrases) at average loudness >/= 80 dB and with loud, good quality voice indep. Baseline: Goal status: PROGRESSING  Pt will sustain conversational loudness (75-85 dB) for 8-10 minutes of conversation with min cues. Baseline:  Goal status:  PROGRESSING   ASSESSMENT:  CLINICAL IMPRESSION: Patient is a 78 y.o. male who was seen today for evaluation in setting of Parkinson's Disease. er Neuro note, 09/15/23, "Parkinson's Disease in a patient with bilateral hand tremor (left > right) + slowness of movement + some changes in balance + hypophonia + micro graphia + hypomimia + minimal bradykinesia on the left + Mild cog wheeling on the right and moderate cog wheeling on the left + decreased arm swing on the left + slight hunched forward posture - symptomatic, without new or worsening features." Pt taking carbidopa-levodopa for Parkinson's Disease with recent increased in dosage.  Pt with hx HTN, HLD, Vit B12 and Vit deficiencies. MRI brain, 12/28/21, "no a cute infarction, hemorrhage, or mass. Mild chronic microvascular ischemic changes." Pt presents with a mild hypokinetic dysarthric c/b hypophonia, mildly hoarse vocal quality and intermittent fast rushes of speech. Evidence of volume decay noted. Wife endorsed that speech is worse in home with pt often "muttering." Wife also identified x2 persons who have stopped communicating with pt due to difficulty hearing him. Pt stimulable to verbal cues and would benefit from course of ST to improve pt's ability to communicate at home and in the community.  OBJECTIVE IMPAIRMENTS include dysarthria. These impairments are limiting patient from effectively communicating at home and in community. Factors affecting potential to achieve goals and functional outcome are n/a. Patient will benefit from skilled SLP services to address above impairments and improve overall function.  REHAB POTENTIAL: Good  PLAN: SLP FREQUENCY: 1-2x/week  SLP DURATION: 12 weeks  PLANNED INTERVENTIONS: Functional tasks, Multimodal communication approach, SLP instruction and feedback, Compensatory strategies, Patient/family education, and further cognitive-linguistic assessment.   Dia Forget, M.S., CCC-SLP Speech-Language  Pathologist [place] [facility] [phone] [address] Clayman)   [place] [facility] Outpatient Rehabilitation at [facility] [address] [place], [place], [address] Phone: [phone]   Fax:  [phone]

## 2023-12-05 NOTE — Therapy (Unsigned)
 OUTPATIENT PHYSICAL THERAPY NEURO TREATMENT    Patient Name: Derrick Bishop MRN: [id number] DOB:1946/02/01, 78 y.o., male Today's Date: 12/05/2023   PCP: Dr. Firman Hughes REFERRING PROVIDER: Dr. Devora Folks   END OF SESSION:  PT End of Session - 12/05/23 1547     Visit Number 19    Number of Visits 24    Date for PT Re-Evaluation 12/26/23    Progress Note Due on Visit 20    PT Start Time 1615    PT Stop Time 1655    PT Time Calculation (min) 40 min    Equipment Utilized During Treatment Gait belt    Activity Tolerance Patient tolerated treatment well    Behavior During Therapy WFL for tasks assessed/performed                   Past Medical History:  Diagnosis Date   Hypertension    No past surgical history on file. There are no active problems to display for this patient.   ONSET DATE: 3 years ago  REFERRING DIAG: Parkinson's disease without dyskinesia, without mention of fluctuations   THERAPY DIAG:  Difficulty in walking, not elsewhere classified  Abnormality of gait and mobility  Muscle weakness (generalized)  Unsteadiness on feet  Other lack of coordination  Parkinson's disease without dyskinesia or fluctuating manifestations (HCC)  Rationale for Evaluation and Treatment: Rehabilitation  SUBJECTIVE:                                                                                                                                                                                             SUBJECTIVE STATEMENT:   Pt reports that he is doing well. No pain reported on this day and no falls at home. Did not do much over the weekend.    Pt accompanied by: self  PERTINENT HISTORY:  History obtained by Dr. Mason Sole on 09/15/2023, the below from that note: Assessment & Plan Parkinson's Disease in a patient with bilateral hand tremor (left > right) + slowness of movement + some changes in balance + hypophonia + micro graphia + hypomimia + minimal  bradykinesia on the left + Mild cog wheeling on the right and moderate cog wheeling on the left + decreased arm swing on the left + slight hunched forward posture - symptomatic, without new or worsening features   Parkinson's disease with worsening symptoms, including increased hand tremors, balance problems, and freezing episodes. Current treatment with carbidopa-levodopa three times daily shows no noticeable symptom improvement. Increasing the dose aims to reduce tremors, bradykinesia, and rigidity. The medication is safe with no long-term damage, and higher doses  may be necessary as the disease progresses. Early and adequate dosing can preserve functional years, and the highest tolerable dose should be administered early to maximize functional outcomes. - Increase carbidopa-levodopa to 25-100 mg two times a day  Increase carbidopa-levodopa to 1.5 pills three times daily for two weeks, then increase to 2 pills three times daily. - Order Parkinson's-specific Physical Therapy and Speech Therapy   PAIN:  Are you having pain? No  PRECAUTIONS: Fall  RED FLAGS: None   WEIGHT BEARING RESTRICTIONS: No  FALLS: Has patient fallen in last 6 months? No  LIVING ENVIRONMENT: Lives with: lives with their spouse Lives in: House/apartment Stairs: Yes: External: 3 steps; In process of having railings installed in garage Has following equipment at home: Single point cane  PLOF: Independent  PATIENT GOALS: improve my walking and confidence in getting around.   OBJECTIVE:  Note: Objective measures were completed at Evaluation unless otherwise noted.  DIAGNOSTIC FINDINGS:  Narrative & Impression  CLINICAL DATA:  Neuro deficit, acute, stroke suspected   EXAM: MRI HEAD WITHOUT CONTRAST   TECHNIQUE: Multiplanar, multiecho pulse sequences of the brain and surrounding structures were obtained without intravenous contrast.   COMPARISON:  2012   FINDINGS: Brain: There is no acute infarction or  intracranial hemorrhage. There is no intracranial mass, mass effect, or edema. There is no hydrocephalus or extra-axial fluid collection. Ventricles and sulci are within normal limits in size and configuration. Patchy T2 hyperintensity in the supratentorial white matter is nonspecific but may reflect mild chronic microvascular ischemic changes.   Vascular: Major vessel flow voids at the skull base are preserved.   Skull and upper cervical spine: Normal marrow signal is preserved.   Sinuses/Orbits: Paranasal sinus mucosal thickening. Orbits are unremarkable.   Other: Sella is unremarkable.  Mastoid air cells are clear.   IMPRESSION: No acute infarction, hemorrhage, or mass.   Mild chronic microvascular ischemic changes.     Electronically Signed   By: Geannie Keener M.D.   On: 12/28/2021 13:07      COGNITION: Overall cognitive status: Within functional limits for tasks assessed   SENSATION: WFL  COORDINATION: Slow to initiate at times  EDEMA:  None observed    POSTURE: rounded shoulders and forward head  LOWER EXTREMITY ROM:     Active  Right Eval Left Eval  Hip flexion    Hip extension    Hip abduction    Hip adduction    Hip internal rotation    Hip external rotation    Knee flexion    Knee extension    Ankle dorsiflexion    Ankle plantarflexion    Ankle inversion    Ankle eversion     (Blank rows = not tested)  LOWER EXTREMITY MMT:    MMT Right Eval Left Eval  Hip flexion 4 4  Hip extension 4 4  Hip abduction 4 4  Hip adduction    Hip internal rotation 4 4  Hip external rotation 4 4  Knee flexion 4 4  Knee extension 4 4  Ankle dorsiflexion 4 4  Ankle plantarflexion    Ankle inversion    Ankle eversion    (Blank rows = not tested)  BED MOBILITY:  NOT TESTED- Patient reports independent  TRANSFERS: Assistive device utilized: None  Sit to stand: Complete Independence Stand to sit: Complete Independence Chair to chair: Complete  Independence Floor: Not tested  GAIT: Gait pattern: decreased arm swing- Right, decreased arm swing- Left, decreased step length- Right, decreased  step length- Left, decreased stance time- Right, decreased stance time- Left, decreased stride length, shuffling, festinating, poor foot clearance- Right, and poor foot clearance- Left Distance walked: > 100 feet Assistive device utilized: has cane today but did not use- states only using in unfamiliar community settings Level of assistance: CGA   FUNCTIONAL TESTS:  5 times sit to stand: 18.81 sec without UE support Timed up and go (TUG): 16.03 sec avg without AD 6 minute walk test: 937 ftt 10 meter walk test: 0.79 m/s avg without AD Berg Balance Scale: 43/56  PATIENT SURVEYS:  ABC scale 75.6%                                                                                                                              TREATMENT DATE: 12/05/23   PT instructed pt in Circuit training to improve large amplidue movement and improve reciprocal movement pattern as well trunkal rotation:  Weighted gait training 4# AW 3x 572ft cues for improved step length and heel contact as well as improved UE reciprocal movement when fatigued.  Sit<>stand with UE swing into extension 3x 10  Seated Lateral step of BLE to side of chair and back to neutral 3x 10 bil with cues for improved step width L>R and improved hip hike to initiate pelvic rotation  Superior/lateral reach x with weight shift 3x 10 bil hold x 2sec.  Improved trunkal rotation noted with increased sets, but fatigue then limits continued consistency of trunkal rotation.     Throughout session PT provided CGA for safety and gait belt in place for increased safety.    PATIENT EDUCATION: Education details: large amplitude technique, HEP,  Person educated: Patient Education method: Medical illustrator, VC printout Education comprehension: verbalized understanding, returned demonstration,  and verbal cues required  HOME EXERCISE PROGRAM:  Access Code: [email or web address] URL: [email or web address] Date: 10/18/2023 Prepared by: Carlen Chasten  Exercises - Walking  - 1 x daily - 7 x weekly - 1 sets - 1 reps - 6-10 minutes hold - Sit to Stand Without Arm Support  - 1 x daily - 5-6 x weekly - 2-3 sets - 10 reps - Seated Finger Flicks with Elbow Extension  - 1 x daily - 7 x weekly - 2 sets - 20 reps - Seated Reaching to Side and Across Body  - 1 x daily - 7 x weekly - 2 sets - 10 reps  GOALS: Goals reviewed with patient? Yes  SHORT TERM GOALS: Target date: 11/14/2023  Pt will be independent with HEP in order to improve strength and balance in order to decrease fall risk and improve function at home and work. Baseline: 5/7: Every other day, tries to incorporate into everyday tasks  Goal status: ONGOING   LONG TERM GOALS: Target date: 12/26/2023  1.  Patient (> 40 years old) will complete five times sit to stand test in < 15 seconds indicating an increased LE strength and improved balance. Baseline: EVAL=  18.81 sec without UE 11/02/23: 9.26 sec  Goal status: ONGOING  2.  Patient will improve ABC score to >80%   to demonstrate statistically significant improvement in mobility and quality of life as it relates to their confidence in his balance.  Baseline: EVAL=75.6 % 5/7: 85% Goal status: INITIAL   4.  Patient will increase Berg Balance score by > 6 points to demonstrate decreased fall risk during functional activities. Baseline: EVAL= 43/56 5/7: 47 Goal status: ONGOING   5.   Patient will reduce timed up and go to <11 seconds to reduce fall risk and demonstrate improved transfer/gait ability. Baseline: EVAL= 16.03 without AD 5/7:12.43 sec Goal status: MET   6.   Patient will increase 10 meter walk test to >1.1m/s as to improve gait speed for better community ambulation and to reduce fall risk. Baseline: EVAL= 0.79 m/s avg without AD 5/7:1.02 m/s Goal status:  MET  7.   Patient will increase six minute walk test distance to >1000 for progression to community ambulator and improve gait ability Baseline: EVAL: To be tested next visit; 10/05/23: 937 ft 5/7:1020 ft Goal status: MET    ASSESSMENT:  CLINICAL IMPRESSION:   Patient presents with good motivation for today's session. PT treatment focused on endurance, rotation and large amplidue movements. Cues for improved step length and reciprocal movement with weighted gait with fatigue as well as improved trunk and pelvic rotation intermittently. Pt demonstrates good carryover, but fatigue then limits consistency.  Pt will continue to benefit from skilled physical therapy intervention to address impairments, improve QOL, and attain therapy goals.     OBJECTIVE IMPAIRMENTS: Abnormal gait, decreased balance, decreased coordination, decreased endurance, decreased mobility, difficulty walking, decreased ROM, decreased strength, hypomobility, impaired flexibility, and postural dysfunction.   ACTIVITY LIMITATIONS: carrying, lifting, bending, standing, squatting, stairs, and transfers  PARTICIPATION LIMITATIONS: cleaning, shopping, community activity, and yard work  PERSONAL FACTORS: 1-2 comorbidities: HTN are also affecting patient's functional outcome.   REHAB POTENTIAL: Good  CLINICAL DECISION MAKING: Stable/uncomplicated  EVALUATION COMPLEXITY: Low  PLAN:  PT FREQUENCY: 1-2x/week  PT DURATION: 12 weeks  PLANNED INTERVENTIONS: 97164- PT Re-evaluation, 97110-Therapeutic exercises, 97530- Therapeutic activity, [id number]- Neuromuscular re-education, 97535- Self Care, [id number]- Manual therapy, [id number]- Gait training, [id number]- Orthotic Fit/training, [phone]- Canalith repositioning, [phone]- Electrical stimulation (manual), Patient/Family education, Balance training, Stair training, Taping, Dry Needling, Joint mobilization, Joint manipulation, Spinal manipulation, Spinal mobilization, Vestibular training, DME  instructions, Cryotherapy, and Moist heat  PLAN FOR NEXT SESSION:   - continue dynamic gait training addressing freezing when turning; using clock face reference.  - backwards gait training - dual-task challenges and/or motor sequencing challenges could use Blaze Pods - progress HEP as appropriate - Postural strengthening/trunk and LE ROM to assist with flexibility  Barbara Book PT  Physical Therapist- [facility]  [facility]   3:47 PM 12/05/23

## 2023-12-07 ENCOUNTER — Ambulatory Visit

## 2023-12-07 ENCOUNTER — Ambulatory Visit: Admitting: Physical Therapy

## 2023-12-07 DIAGNOSIS — R2681 Unsteadiness on feet: Secondary | ICD-10-CM

## 2023-12-07 DIAGNOSIS — M6281 Muscle weakness (generalized): Secondary | ICD-10-CM

## 2023-12-07 DIAGNOSIS — R471 Dysarthria and anarthria: Secondary | ICD-10-CM | POA: Diagnosis not present

## 2023-12-07 DIAGNOSIS — G20A1 Parkinson's disease without dyskinesia, without mention of fluctuations: Secondary | ICD-10-CM

## 2023-12-07 DIAGNOSIS — R269 Unspecified abnormalities of gait and mobility: Secondary | ICD-10-CM

## 2023-12-07 DIAGNOSIS — R262 Difficulty in walking, not elsewhere classified: Secondary | ICD-10-CM

## 2023-12-07 NOTE — Therapy (Signed)
 OUTPATIENT PHYSICAL THERAPY NEURO TREATMENT PHYSICAL THERAPY PROGRESS NOTE   Dates of reporting period  11/02/23   to   12/07/2023      Patient Name: Derrick Bishop MRN: [id number] DOB:[birth year], 78 y.o., male Today's Date: 12/07/2023   PCP: Dr. Firman Hughes REFERRING PROVIDER: Dr. Devora Folks   END OF SESSION:  PT End of Session - 12/07/23 1554     Visit Number 20    Number of Visits 24    Date for PT Re-Evaluation 12/26/23    Progress Note Due on Visit 20    PT Start Time 1610    PT Stop Time 1650    PT Time Calculation (min) 40 min    Equipment Utilized During Treatment Gait belt    Activity Tolerance Patient tolerated treatment well    Behavior During Therapy WFL for tasks assessed/performed                   Past Medical History:  Diagnosis Date   Hypertension    No past surgical history on file. There are no active problems to display for this patient.   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 No pain reported on this day and no falls at home. Feels like he is moving better at home and is more confident with lateral and reverse gait. Is planning in attending [employer] following d/c from PT.     Pt accompanied by: self  PERTINENT HISTORY:  History obtained by Dr. Mason Sole on 09/15/2023, the below from that note: Assessment &  Plan Parkinson's Disease in a patient with bilateral hand tremor (left > right) + slowness of movement + some changes in balance + hypophonia + micro graphia + hypomimia + minimal bradykinesia on the left + Mild cog wheeling on the right and moderate cog wheeling on the left + decreased arm swing on the left + slight hunched forward posture - symptomatic, without new or worsening features   Parkinson's disease with worsening symptoms, including increased hand tremors, balance problems,  and freezing episodes. Current treatment with carbidopa-levodopa three times daily shows no noticeable symptom improvement. Increasing the dose aims to reduce tremors, bradykinesia, and rigidity. The medication is safe with no long-term damage, and higher doses may be necessary as the disease progresses. Early and adequate dosing can preserve functional years, and the highest tolerable dose should be administered early to maximize functional outcomes. - Increase carbidopa-levodopa to 25-100 mg two times a day  Increase carbidopa-levodopa to 1.5 pills three times daily for two weeks, then increase to 2 pills three times daily. - Order Parkinson's-specific Physical Therapy and Speech Therapy   PAIN:  Are you having pain? No  PRECAUTIONS: Fall  RED FLAGS: None   WEIGHT BEARING RESTRICTIONS: No  FALLS: Has patient fallen in last 6 months? No  LIVING ENVIRONMENT: Lives with: lives with their spouse Lives in: House/apartment Stairs: Yes: External: 3 steps; In process of having railings installed in garage Has following equipment at home: Single point cane  PLOF: Independent  PATIENT GOALS: improve my walking and confidence in getting around.   OBJECTIVE:  Note: Objective measures were completed at Evaluation unless otherwise noted.  DIAGNOSTIC FINDINGS:  Narrative & Impression  CLINICAL DATA:  Neuro deficit, acute, stroke suspected   EXAM: MRI HEAD WITHOUT CONTRAST   TECHNIQUE: Multiplanar, multiecho  pulse sequences of the brain and surrounding structures were obtained without intravenous contrast.   COMPARISON:  2012   FINDINGS: Brain: There is no acute infarction or intracranial hemorrhage. There is no intracranial mass, mass effect, or edema. There is no hydrocephalus or extra-axial fluid collection. Ventricles and sulci are within normal limits in size and configuration. Patchy T2 hyperintensity in the supratentorial white matter is nonspecific but may reflect mild chronic microvascular ischemic changes.   Vascular: Major vessel flow voids at the skull base are preserved.   Skull and upper cervical spine: Normal marrow signal is preserved.   Sinuses/Orbits: Paranasal sinus mucosal thickening. Orbits are unremarkable.   Other: Sella is unremarkable.  Mastoid air cells are clear.   IMPRESSION: No acute infarction, hemorrhage, or mass.   Mild chronic microvascular ischemic changes.     Electronically Signed   By: Geannie Keener M.D.   On: 12/28/2021 13:07      COGNITION: Overall cognitive status: Within functional limits for tasks assessed   SENSATION: WFL  COORDINATION: Slow to initiate at times  EDEMA:  None observed    POSTURE: rounded shoulders and forward head  LOWER EXTREMITY ROM:     Active  Right Eval Left Eval  Hip flexion    Hip extension    Hip abduction    Hip adduction    Hip internal rotation    Hip external rotation    Knee flexion    Knee extension    Ankle dorsiflexion    Ankle plantarflexion    Ankle inversion    Ankle eversion     (Blank rows = not tested)  LOWER EXTREMITY MMT:    MMT Right Eval Left Eval  Hip flexion 4 4  Hip extension 4 4  Hip abduction 4 4  Hip adduction    Hip internal rotation 4 4  Hip external rotation 4 4  Knee flexion 4 4  Knee extension 4 4  Ankle dorsiflexion 4 4  Ankle plantarflexion    Ankle inversion    Ankle eversion    (Blank rows = not tested)  BED MOBILITY:  NOT TESTED-  Patient reports independent  TRANSFERS: Assistive device utilized: None  Sit to stand: Complete Independence Stand to sit: Complete Independence Chair to chair: Complete Independence Floor: Not tested  GAIT: Gait pattern: decreased arm swing- Right, decreased arm swing- Left, decreased step length- Right, decreased step length- Left, decreased stance time- Right, decreased stance time- Left, decreased stride length, shuffling, festinating, poor foot clearance- Right, and poor foot clearance- Left Distance walked: > 100 feet Assistive device utilized: has cane today but did not use- states only using in unfamiliar community settings Level of assistance: CGA   FUNCTIONAL TESTS:  5 times sit to stand: 18.81 sec without UE support Timed up and go (TUG): 16.03 sec avg without AD 6 minute walk test: 937 ftt 10 meter walk test: 0.79 m/s avg without AD Berg Balance Scale: 43/56  PATIENT SURVEYS:  ABC scale 75.6%                                                                                                                              TREATMENT DATE: 12/07/23    Throughout session PT provided CGA for safety and gait belt in place for increased safety.    6 Min Walk Test:  Instructed patient to ambulate as quickly and as safely as possible for 6 minutes using LRAD. Patient was allowed to take standing rest breaks without stopping the test, but if the patient required a sitting rest break the clock would be stopped and the test would be over.  Results: 1380 feet  using no AD without assist. Results indicate that the patient has reduced endurance with ambulation compared to age matched norms.  Age Matched Norms: 71-69 yo M: 42 F: 87, 79-79 yo M: 56 F: 471, 83-89 yo M: 417 F: 392 MDC: 58.21 meters (190.98 feet) or 50 meters (ANPTA Core Set of Outcome Measures for Adults with Neurologic Conditions, 2018)  Patient demonstrates increased fall risk as noted by score of   49/56 on Berg  Balance Scale.  (<36= high risk for falls, close to 100%; 37-45 significant >80%; 46-51 moderate >50%; 52-55 lower >25%)  Pt performed 5 time sit<>stand (5xSTS): 10.91 sec (>15 sec indicates increased fall risk)   PT instructed pt in TUG: 11.26sec no AD (average of 3 trials; >13.5 sec indicates increased fall risk)     PATIENT EDUCATION: Education details: large amplitude technique, HEP,  Person educated: Patient Education method: Medical illustrator, VC printout Education comprehension: verbalized understanding, returned demonstration, and verbal cues required  HOME EXERCISE PROGRAM:  Access Code: [email or web address] URL: [email or web address] Date: 10/18/2023 Prepared by: Carlen Chasten  Exercises - Walking  - 1 x daily - 7 x weekly - 1 sets - 1 reps - 6-10 minutes hold - Sit to Stand Without Arm Support  - 1 x daily - 5-6 x weekly - 2-3 sets - 10 reps - Seated Finger Flicks with Elbow Extension  - 1 x daily - 7 x weekly - 2 sets - 20 reps - Seated Reaching to Side and  Across Body  - 1 x daily - 7 x weekly - 2 sets - 10 reps  GOALS: Goals reviewed with patient? Yes  SHORT TERM GOALS: Target date: 11/14/2023  Pt will be independent with HEP in order to improve strength and balance in order to decrease fall risk and improve function at home and work. Baseline: 5/7: Every other day, tries to incorporate into everyday tasks  Goal status:MET   LONG TERM GOALS: Target date: 12/26/2023  1.  Patient (> 25 years old) will complete five times sit to stand test in < 15 seconds indicating an increased LE strength and improved balance. Baseline: EVAL= 18.81 sec without UE 11/02/23: 9.26 sec  6/11: 10.91sec no UE support  Goal status: MET  2.  Patient will improve ABC score to >80%   to demonstrate statistically significant improvement in mobility and quality of life as it relates to their confidence in his balance.  Baseline: EVAL=75.6 % 5/7: 85% 6/11: 89%  Goal status:  MET   3.  Patient will increase Berg Balance score by > 6 points to demonstrate decreased fall risk during functional activities. Baseline: EVAL= 43/56 5/7: 47 6/11: 49/56 Goal status: ONGOING    4.   Patient will reduce timed up and go to <11 seconds to reduce fall risk and demonstrate improved transfer/gait ability. Baseline: EVAL= 16.03 without AD 5/7:12.43 sec 6/11: 11.26sec no AD  Goal status: MET   5.   Patient will increase 10 meter walk test to >1.60m/s as to improve gait speed for better community ambulation and to reduce fall risk. Baseline: EVAL= 0.79 m/s avg without AD 5/7:1.02 m/s Goal status: MET  6.   Patient will increase six minute walk test distance to >1000 for progression to community ambulator and improve gait ability Baseline: EVAL: To be tested next visit; 10/05/23: 937 ft 5/7:1020 ft 6/11: 1310ft with no AD Goal status: MET    ASSESSMENT:  CLINICAL IMPRESSION:   Patient presents with good motivation for today's session. PT treatment focused goal assessment for progress note. Pt has met 5 of 6 LTG and 1 of 1 STG. Noted to have significantly improved distance and sustain reciprocal pattern for entire 6 min walk test. Reduced fall risk noted with reduced TUG, 5x STS, and improved Berg Balance scale.  Pt in agreement to transition to home and community based exercise program over the next 2 weeks. Will initiate instruction for use of fitness center at follow-up appointments. Pt will continue to benefit from skilled physical therapy intervention to address impairments, improve QOL, and attain therapy goals.     OBJECTIVE IMPAIRMENTS: Abnormal gait, decreased balance, decreased coordination, decreased endurance, decreased mobility, difficulty walking, decreased ROM, decreased strength, hypomobility, impaired flexibility, and postural dysfunction.   ACTIVITY LIMITATIONS: carrying, lifting, bending, standing, squatting, stairs, and transfers  PARTICIPATION  LIMITATIONS: cleaning, shopping, community activity, and yard work  PERSONAL FACTORS: 1-2 comorbidities: HTN are also affecting patient's functional outcome.   REHAB POTENTIAL: Good  CLINICAL DECISION MAKING: Stable/uncomplicated  EVALUATION COMPLEXITY: Low  PLAN:  PT FREQUENCY: 1-2x/week  PT DURATION: 12 weeks  PLANNED INTERVENTIONS: 97164- PT Re-evaluation, 97110-Therapeutic exercises, 97530- Therapeutic activity, [id number]- Neuromuscular re-education, 97535- Self Care, 29528- Manual therapy, [id number]- Gait training, [phone]- Orthotic Fit/training, [phone]- Canalith repositioning, [phone]- Electrical stimulation (manual), Patient/Family education, Balance training, Stair training, Taping, Dry Needling, Joint mobilization, Joint manipulation, Spinal manipulation, Spinal mobilization, Vestibular training, DME instructions, Cryotherapy, and Moist heat  PLAN FOR NEXT SESSION:   - continue dynamic gait training  addressing freezing when turning; using clock face reference.  -initiate community based fitness/strengthening.  -expand HEP.   Barbara Book PT  Physical Therapist- [facility]   3:57 PM 12/07/23

## 2023-12-07 NOTE — Therapy (Signed)
 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 Patient is making steady progress toward LTGs this reporting period. See skilled intervention, clinical impressions, and goals below for details.     End of Session - 12/07/23 1549     Visit Number 20    Number of Visits 24    Date for SLP Re-Evaluation 12/26/23    SLP Start Time 1500    SLP Stop Time  1545    SLP Time Calculation (min) 45 min             Past Medical History:  Diagnosis Date   Hypertension    No past surgical history on file. There are no active problems to display for this patient.   ONSET DATE:  ~3 years ago;   REFERRING DIAG: Parkinson's Disease without dyskinesia, without mention of fluctuations  THERAPY DIAG:  Dysarthria and anarthria  Rationale for Evaluation and Treatment Rehabilitation  SUBJECTIVE:   SUBJECTIVE STATEMENT: Pt alert, pleasant, and cooperative. Pt accompanied by: self  PERTINENT HISTORY: Per Neuro note, 09/15/23, Parkinson's Disease in a patient with bilateral hand tremor (left > right) + slowness of movement + some changes in balance + hypophonia + micro graphia + hypomimia + minimal bradykinesia on the left + Mild cog wheeling on the right and moderate cog wheeling on the left + decreased arm swing on the left + slight hunched forward posture - symptomatic, without new or worsening features. Pt with hx HTN, HLD, Vit B12 and Vit deficiencies. MRI brain, 12/28/21, no a cute infarction, hemorrhage, or mass. Mild chronic microvascular ischemic changes.    PAIN:  Are  you having pain? No   FALLS: Has patient fallen in last 6 months? Defer to PT  LIVING ENVIRONMENT: Lives with: lives with their spouse Lives in: House/apartment  PLOF:  ambulates with single point cane; driving; indep  PATIENT GOALS  for speech to improve  OBJECTIVE:  TODAY'S TREATMENT:  Reviewed EMST including purpose, how to complete ex's, and maintenance. Pt completed x5 sets of x5 reps of EMST with indep use of hand placement to facilitate adequate lip seal given Bell's Palsy.   Pt introduced to [employer] and its home practice ex's. Pt participated in class with cueing from SLP and biofeedback with Voice Analyst app.  Voice ex's completed as follows: May-Me-My-Moe-Moo: 85dB Sustained /a/: x5, 85-86 dB Ascending and descending pitch glides: 85dB   Pt read x5 functional phrases twice through with loud and intentional voice, averaging 85 dB.   Pt averaged 74-75 dB during 10 minute conversation sample.  Pt educated re: changes to speech/voice due to Parkinson's Disease, voice ex's, EMST, SLP POC, and HEP.  PATIENT EDUCATION: Education details: as above Person educated: Patient  Education method: Programmer, multimedia; Handout Education comprehension: verbalized understanding and needs further education   HOME EXERCISE PROGRAM: Voice Ex's and Functional Reading given previous / HEP on Parkinson's Voice Project EMST  GOALS: Goals reviewed with patient? Yes  SHORT TERM GOALS: Target date: 10 sessions  Pt will participate in further assessment of functional cognitive-linguistic ability with additional goals to be set as appropriate. Baseline: Goal status: MET  2.  The patient will complete HEP (Maximum duration ah, High/Lows, and Functional Phrases) at average loudness >/= 80 dB and with loud, good quality voice with min cues.  Baseline: Goal status: MET  3.  Pt will sustain conversational loudness (75-85 dB) for 3-5 minutes of conversation with min  cues. Baseline:  Goal status: MET    LONG TERM GOALS: Target date: 12 weeks  The patient will complete HEP (Maximum duration ah, High/Lows, and Functional Phrases) at average loudness >/= 80 dB and with loud, good quality voice indep. Baseline: Goal status: PROGRESSING  Pt will sustain conversational loudness (75-85 dB) for 8-10 minutes of conversation with min cues. Baseline:  Goal status: PROGRESSING   ASSESSMENT:  CLINICAL IMPRESSION: Patient is a 78 y.o. male who was seen today for evaluation in setting of Parkinson's Disease. er Neuro note, 09/15/23, Parkinson's Disease in a patient with bilateral hand tremor (left > right) + slowness of movement + some changes in balance + hypophonia + micro graphia + hypomimia + minimal bradykinesia on the left + Mild cog wheeling on the right and moderate cog wheeling on the left + decreased arm swing on the left + slight hunched forward posture - symptomatic, without new or worsening features. Pt taking carbidopa-levodopa for Parkinson's Disease with recent increased in dosage.  Pt with hx HTN, HLD, Vit B12 and Vit deficiencies. MRI brain, 12/28/21, no a cute infarction, hemorrhage, or mass. Mild chronic microvascular ischemic changes. Pt presents with a mild hypokinetic dysarthric c/b hypophonia, mildly hoarse vocal quality and intermittent fast rushes of speech. Evidence of volume decay noted. Wife endorsed that speech is worse in home with pt often muttering. Wife also identified x2 persons who have stopped communicating with pt due to difficulty hearing him. Pt stimulable to verbal cues and would benefit from course of ST to improve pt's ability to communicate at home and in the community.  OBJECTIVE IMPAIRMENTS include dysarthria. These impairments are limiting patient from effectively communicating at home and in community. Factors affecting potential to achieve goals and functional outcome are n/a. Patient will benefit from skilled SLP  services to address above impairments and improve overall function.  REHAB POTENTIAL: Good  PLAN: SLP FREQUENCY: 1-2x/week  SLP DURATION: 12 weeks  PLANNED INTERVENTIONS: Functional tasks, Multimodal communication approach, SLP instruction and feedback, Compensatory strategies, Patient/family education, and further cognitive-linguistic assessment.   Dia Forget, M.S., CCC-SLP Speech-Language Pathologist [facility] [phone])   [place] [facility] [address] [place], [place], [address] Phone: [phone]   Fax:  [phone]

## 2023-12-12 ENCOUNTER — Ambulatory Visit

## 2023-12-12 DIAGNOSIS — G20A1 Parkinson's disease without dyskinesia, without mention of fluctuations: Secondary | ICD-10-CM

## 2023-12-12 DIAGNOSIS — R471 Dysarthria and anarthria: Secondary | ICD-10-CM | POA: Diagnosis not present

## 2023-12-12 DIAGNOSIS — M6281 Muscle weakness (generalized): Secondary | ICD-10-CM

## 2023-12-12 DIAGNOSIS — R262 Difficulty in walking, not elsewhere classified: Secondary | ICD-10-CM

## 2023-12-12 DIAGNOSIS — R2681 Unsteadiness on feet: Secondary | ICD-10-CM

## 2023-12-12 DIAGNOSIS — R278 Other lack of coordination: Secondary | ICD-10-CM

## 2023-12-12 DIAGNOSIS — R269 Unspecified abnormalities of gait and mobility: Secondary | ICD-10-CM

## 2023-12-12 NOTE — Therapy (Signed)
 OUTPATIENT PHYSICAL THERAPY NEURO TREATMENT       Patient Name: Derrick Bishop MRN: [id number] DOB:1945/07/07, 78 y.o., male Today's Date: 12/13/2023   PCP: Dr. Firman Hughes REFERRING PROVIDER: Dr. Devora Folks   END OF SESSION:  PT End of Session - 12/13/23 1721     Visit Number 21    Number of Visits 24    Date for PT Re-Evaluation 12/26/23    Progress Note Due on Visit 30    PT Start Time 1616    PT Stop Time 1700    PT Time Calculation (min) 44 min    Equipment Utilized During Treatment Gait belt    Activity Tolerance Patient tolerated treatment well    Behavior During Therapy WFL for tasks assessed/performed                 Past Medical History:  Diagnosis Date   Hypertension    History reviewed. No pertinent surgical history. There are no active problems to display for this patient.   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 States he may be interested in checking out the [facility] for a gym based option.     Pt accompanied by: self  PERTINENT HISTORY:  History obtained by Dr. Mason Sole on 09/15/2023, the below from that note: Assessment & Plan Parkinson's Disease in a patient with bilateral hand tremor (left > right) + slowness of movement + some changes in  balance + hypophonia + micro graphia + hypomimia + minimal bradykinesia on the left + Mild cog wheeling on the right and moderate cog wheeling on the left + decreased arm swing on the left + slight hunched forward posture - symptomatic, without new or worsening features   Parkinson's disease with worsening symptoms, including increased hand tremors, balance problems, and freezing episodes. Current treatment with carbidopa-levodopa three times daily shows no noticeable symptom improvement. Increasing the dose aims to reduce tremors, bradykinesia, and rigidity. The medication is safe with  no long-term damage, and higher doses may be necessary as the disease progresses. Early and adequate dosing can preserve functional years, and the highest tolerable dose should be administered early to maximize functional outcomes. - Increase carbidopa-levodopa to 25-100 mg two times a day  Increase carbidopa-levodopa to 1.5 pills three times daily for two weeks, then increase to 2 pills three times daily. - Order Parkinson's-specific Physical Therapy and Speech Therapy   PAIN:  Are you having pain? No  PRECAUTIONS: Fall  RED FLAGS: None   WEIGHT BEARING RESTRICTIONS: No  FALLS: Has patient fallen in last 6 months? No  LIVING ENVIRONMENT: Lives with: lives with their spouse Lives in: House/apartment Stairs: Yes: External: 3 steps; In process of having railings installed in garage Has following equipment at home: Single point cane  PLOF: Independent  PATIENT GOALS: improve my walking and confidence in getting around.   OBJECTIVE:  Note: Objective measures were completed at Evaluation unless otherwise noted.  DIAGNOSTIC FINDINGS:  Narrative & Impression  CLINICAL DATA:  Neuro deficit, acute, stroke suspected   EXAM: MRI HEAD WITHOUT CONTRAST   TECHNIQUE: Multiplanar, multiecho pulse sequences of the brain and surrounding structures were obtained without intravenous contrast.   COMPARISON:   2012   FINDINGS: Brain: There is no acute infarction or intracranial hemorrhage. There is no intracranial mass, mass effect, or edema. There is no hydrocephalus or extra-axial fluid collection. Ventricles and sulci are within normal limits in size and configuration. Patchy T2 hyperintensity in the supratentorial white matter is nonspecific but may reflect mild chronic microvascular ischemic changes.   Vascular: Major vessel flow voids at the skull base are preserved.   Skull and upper cervical spine: Normal marrow signal is preserved.   Sinuses/Orbits: Paranasal sinus mucosal thickening. Orbits are unremarkable.   Other: Sella is unremarkable.  Mastoid air cells are clear.   IMPRESSION: No acute infarction, hemorrhage, or mass.   Mild chronic microvascular ischemic changes.     Electronically Signed   By: Geannie Keener M.D.   On: 12/28/2021 13:07      COGNITION: Overall cognitive status: Within functional limits for tasks assessed   SENSATION: WFL  COORDINATION: Slow to initiate at times  EDEMA:  None observed    POSTURE: rounded shoulders and forward head  LOWER EXTREMITY ROM:     Active  Right Eval Left Eval  Hip flexion    Hip extension    Hip abduction    Hip adduction    Hip internal rotation    Hip external rotation    Knee flexion    Knee extension    Ankle dorsiflexion    Ankle plantarflexion    Ankle inversion    Ankle eversion     (Blank rows = not tested)  LOWER EXTREMITY MMT:    MMT Right Eval Left Eval  Hip flexion 4 4  Hip extension 4 4  Hip abduction 4 4  Hip adduction    Hip internal rotation 4 4  Hip external rotation 4 4  Knee flexion 4 4  Knee extension 4 4  Ankle dorsiflexion 4 4  Ankle plantarflexion    Ankle inversion    Ankle eversion    (Blank rows = not tested)  BED MOBILITY:  NOT TESTED- Patient reports independent  TRANSFERS: Assistive device utilized: None  Sit to stand: Complete Independence Stand  to sit: Complete Independence Chair to chair: Complete Independence Floor: Not tested  GAIT: Gait pattern: decreased arm swing- Right, decreased arm swing-  Left, decreased step length- Right, decreased step length- Left, decreased stance time- Right, decreased stance time- Left, decreased stride length, shuffling, festinating, poor foot clearance- Right, and poor foot clearance- Left Distance walked: > 100 feet Assistive device utilized: has cane today but did not use- states only using in unfamiliar community settings Level of assistance: CGA   FUNCTIONAL TESTS:  5 times sit to stand: 18.81 sec without UE support Timed up and go (TUG): 16.03 sec avg without AD 6 minute walk test: 937 ftt 10 meter walk test: 0.79 m/s avg without AD Berg Balance Scale: 43/56  PATIENT SURVEYS:  ABC scale 75.6%                                                                                                                              TREATMENT DATE: 12/13/23    Throughout session PT provided CGA for safety and gait belt in place for increased safety.     Self care/Home management:   [facility] activities:  Walked around [facility] and PT demonstrating equipment - [employer], group session room, Treadmill, LE and UE machines and then patient performed Nustep x 10 min and later Octane x 8 min - focusing on ability to use equipment safely - adjusting seat, level of intensity, reading screens to become familiar with equipment.  Spent time discussing importance of continuous movement. Stated would not recommend treadmill at this time due to safety and increased fall risk.      PATIENT EDUCATION: Education details: large amplitude technique, HEP,  Person educated: Patient Education method: Medical illustrator, VC printout Education comprehension: verbalized understanding, returned demonstration, and verbal cues required  HOME EXERCISE PROGRAM:  Access Code: [email or web address] URL:  [email or web address] Date: 10/18/2023 Prepared by: Carlen Chasten  Exercises - Walking  - 1 x daily - 7 x weekly - 1 sets - 1 reps - 6-10 minutes hold - Sit to Stand Without Arm Support  - 1 x daily - 5-6 x weekly - 2-3 sets - 10 reps - Seated Finger Flicks with Elbow Extension  - 1 x daily - 7 x weekly - 2 sets - 20 reps - Seated Reaching to Side and Across Body  - 1 x daily - 7 x weekly - 2 sets - 10 reps  GOALS: Goals reviewed with patient? Yes  SHORT TERM GOALS: Target date: 11/14/2023  Pt will be independent with HEP in order to improve strength and balance in order to decrease fall risk and improve function at home and work. Baseline: 5/7: Every other day, tries to incorporate into everyday tasks  Goal status:MET   LONG TERM GOALS: Target date: 12/26/2023  1.  Patient (> 57 years old) will complete five times sit to stand test in < 15 seconds indicating an increased LE strength and improved balance. Baseline: EVAL= 18.81 sec without UE 11/02/23: 9.26 sec  6/11: 10.91sec no UE support  Goal status: MET  2.  Patient will improve ABC  score to >80%   to demonstrate statistically significant improvement in mobility and quality of life as it relates to their confidence in his balance.  Baseline: EVAL=75.6 % 5/7: 85% 6/11: 89%  Goal status: MET   3.  Patient will increase Berg Balance score by > 6 points to demonstrate decreased fall risk during functional activities. Baseline: EVAL= 43/56 5/7: 47 6/11: 49/56 Goal status: ONGOING    4.   Patient will reduce timed up and go to <11 seconds to reduce fall risk and demonstrate improved transfer/gait ability. Baseline: EVAL= 16.03 without AD 5/7:12.43 sec 6/11: 11.26sec no AD  Goal status: MET   5.   Patient will increase 10 meter walk test to >1.4m/s as to improve gait speed for better community ambulation and to reduce fall risk. Baseline: EVAL= 0.79 m/s avg without AD 5/7:1.02 m/s Goal status: MET  6.   Patient  will increase six minute walk test distance to >1000 for progression to community ambulator and improve gait ability Baseline: EVAL: To be tested next visit; 10/05/23: 937 ft 5/7:1020 ft 6/11: 1377ft with no AD Goal status: MET    ASSESSMENT:  CLINICAL IMPRESSION:  Plan continued per last visit recommendation to focus on Home/community based exercise programs. He was introduced to [facility] and spent time walking around the facility- observing available equipment and PT recommending various activities that he could incorporate into a gym based program. He wanted to practice with the Nustep and later the octane machines today and responded well to all instruction in use today. He will benefit from review and introduction in machines for UE/LE strength training. Pt will continue to benefit from skilled physical therapy intervention to address impairments, improve QOL, and attain therapy goals.     OBJECTIVE IMPAIRMENTS: Abnormal gait, decreased balance, decreased coordination, decreased endurance, decreased mobility, difficulty walking, decreased ROM, decreased strength, hypomobility, impaired flexibility, and postural dysfunction.   ACTIVITY LIMITATIONS: carrying, lifting, bending, standing, squatting, stairs, and transfers  PARTICIPATION LIMITATIONS: cleaning, shopping, community activity, and yard work  PERSONAL FACTORS: 1-2 comorbidities: HTN are also affecting patient's functional outcome.   REHAB POTENTIAL: Good  CLINICAL DECISION MAKING: Stable/uncomplicated  EVALUATION COMPLEXITY: Low  PLAN:  PT FREQUENCY: 1-2x/week  PT DURATION: 12 weeks  PLANNED INTERVENTIONS: 97164- PT Re-evaluation, 97110-Therapeutic exercises, 97530- Therapeutic activity, [id number]- Neuromuscular re-education, 97535- Self Care, 16109- Manual therapy, [phone]- Gait training, [phone]- Orthotic Fit/training, [phone]- Canalith repositioning, [phone]- Electrical stimulation (manual), Patient/Family education, Balance  training, Stair training, Taping, Dry Needling, Joint mobilization, Joint manipulation, Spinal manipulation, Spinal mobilization, Vestibular training, DME instructions, Cryotherapy, and Moist heat  PLAN FOR NEXT SESSION:    -Continue with community based fitness/strengthening.   Murlene Army PT  Physical Therapist- [facility]  5:34 PM 12/13/23

## 2023-12-12 NOTE — Therapy (Signed)
 OUTPATIENT SPEECH LANGUAGE PATHOLOGY PARKINSON'S TREATMENT   Patient Name: Derrick Bishop MRN: [id number] DOB:Sep 01, 1945, 78 y.o., male Today's Date: 12/12/2023  PCP: Firman Hughes, MD REFERRING PROVIDER: Devora Folks, MD    End of Session - 12/12/23 1615     Visit Number 21    Number of Visits 24    Date for SLP Re-Evaluation 12/26/23    SLP Start Time 1530    SLP Stop Time  1615    SLP Time Calculation (min) 45 min    Activity Tolerance Patient tolerated treatment well          Past Medical History:  Diagnosis Date   Hypertension    No past surgical history on file. There are no active problems to display for this patient.   ONSET DATE:  ~3 years ago;   REFERRING DIAG: Parkinson's Disease without dyskinesia, without mention of fluctuations  THERAPY DIAG:  Dysarthria and anarthria  Rationale for Evaluation and Treatment Rehabilitation  SUBJECTIVE:   SUBJECTIVE STATEMENT: Pt alert, pleasant, and cooperative. Pt accompanied by: self  PERTINENT HISTORY: Per Neuro note, 09/15/23, Parkinson's Disease in a patient with bilateral hand tremor (left > right) + slowness of movement + some changes in balance + hypophonia + micro graphia + hypomimia + minimal bradykinesia on the left + Mild cog wheeling on the right and moderate cog wheeling on the left + decreased arm swing on the left + slight hunched forward posture - symptomatic, without new or worsening features. Pt with hx HTN, HLD, Vit B12 and Vit deficiencies. MRI brain, 12/28/21, no a cute infarction, hemorrhage, or mass. Mild chronic microvascular ischemic changes.    PAIN:  Are you having pain? No   FALLS: Has patient fallen in last 6 months? Defer to PT  LIVING ENVIRONMENT: Lives with: lives with their spouse Lives in: House/apartment  PLOF:  ambulates with single point cane; driving; indep  PATIENT GOALS  for speech to improve  OBJECTIVE:  TODAY'S TREATMENT:  Reviewed EMST including purpose, how  to complete ex's, and maintenance. Pt completed x5 sets of x5 reps of EMST with indep use of hand placement to facilitate adequate lip seal given Bell's Palsy.   Pt introduced to [employer] and its home practice ex's. Pt participated in class with cueing from SLP and biofeedback with Voice Analyst app.  Voice ex's completed as follows: May-Me-My-Moe-Moo: 85dB Sustained /a/: x5, 85-86 dB Ascending and descending pitch glides: 85dB   Pt read x12 functional phrases twice through with loud and intentional voice, averaging 84 dB.   Pt averaged >75 dB during 15 minute conversation sample.  Pt educated re: changes to speech/voice due to Parkinson's Disease, voice ex's, EMST, SLP POC, and HEP.  PATIENT EDUCATION: Education details: as above Person educated: Patient  Education method: Programmer, multimedia; Handout Education comprehension: verbalized understanding and needs further education   HOME EXERCISE PROGRAM: Voice Ex's and Functional Reading given previous / HEP on Parkinson's Voice Project EMST  GOALS: Goals reviewed with patient? Yes  SHORT TERM GOALS: Target date: 10 sessions  Pt will participate in further assessment of functional cognitive-linguistic ability with additional goals to be set as appropriate. Baseline: Goal status: MET  2.  The patient will complete HEP (Maximum duration ah, High/Lows, and Functional Phrases) at average loudness >/= 80 dB and with loud, good quality voice with min cues.  Baseline: Goal status: MET  3.  Pt will sustain conversational loudness (75-85 dB) for 3-5 minutes of conversation with min cues. Baseline:  Goal status: MET    LONG TERM GOALS: Target date: 12 weeks  The patient will complete HEP (Maximum duration ah, High/Lows, and Functional Phrases) at average loudness >/= 80 dB and with loud, good quality voice indep. Baseline: Goal status: PROGRESSING  Pt will sustain conversational loudness (75-85 dB) for 8-10  minutes of conversation with min cues. Baseline:  Goal status: PROGRESSING   ASSESSMENT:  CLINICAL IMPRESSION: Patient is a 78 y.o. male who was seen today for evaluation in setting of Parkinson's Disease. er Neuro note, 09/15/23, Parkinson's Disease in a patient with bilateral hand tremor (left > right) + slowness of movement + some changes in balance + hypophonia + micro graphia + hypomimia + minimal bradykinesia on the left + Mild cog wheeling on the right and moderate cog wheeling on the left + decreased arm swing on the left + slight hunched forward posture - symptomatic, without new or worsening features. Pt taking carbidopa-levodopa for Parkinson's Disease with recent increased in dosage.  Pt with hx HTN, HLD, Vit B12 and Vit deficiencies. MRI brain, 12/28/21, no a cute infarction, hemorrhage, or mass. Mild chronic microvascular ischemic changes. Pt presents with a mild hypokinetic dysarthric c/b hypophonia, mildly hoarse vocal quality and intermittent fast rushes of speech. Evidence of volume decay noted. Wife endorsed that speech is worse in home with pt often muttering. Wife also identified x2 persons who have stopped communicating with pt due to difficulty hearing him. Pt stimulable to verbal cues and would benefit from course of ST to improve pt's ability to communicate at home and in the community.  OBJECTIVE IMPAIRMENTS include dysarthria. These impairments are limiting patient from effectively communicating at home and in community. Factors affecting potential to achieve goals and functional outcome are n/a. Patient will benefit from skilled SLP services to address above impairments and improve overall function.  REHAB POTENTIAL: Good  PLAN: SLP FREQUENCY: 1-2x/week  SLP DURATION: 12 weeks  PLANNED INTERVENTIONS: Functional tasks, Multimodal communication approach, SLP instruction and feedback, Compensatory strategies, Patient/family education, and further cognitive-linguistic  assessment.   Dia Forget, M.S., CCC-SLP Speech-Language Pathologist [place] [facility] [phone] [place])    [facility] at [facility] [address] [place], [place], [address] Phone: [phone]   Fax:  [phone]

## 2023-12-14 ENCOUNTER — Ambulatory Visit: Admitting: Physical Therapy

## 2023-12-14 ENCOUNTER — Ambulatory Visit

## 2023-12-14 DIAGNOSIS — R471 Dysarthria and anarthria: Secondary | ICD-10-CM | POA: Diagnosis not present

## 2023-12-14 DIAGNOSIS — G20A1 Parkinson's disease without dyskinesia, without mention of fluctuations: Secondary | ICD-10-CM

## 2023-12-14 DIAGNOSIS — R278 Other lack of coordination: Secondary | ICD-10-CM

## 2023-12-14 DIAGNOSIS — R269 Unspecified abnormalities of gait and mobility: Secondary | ICD-10-CM

## 2023-12-14 DIAGNOSIS — R2681 Unsteadiness on feet: Secondary | ICD-10-CM

## 2023-12-14 DIAGNOSIS — R262 Difficulty in walking, not elsewhere classified: Secondary | ICD-10-CM

## 2023-12-14 DIAGNOSIS — M6281 Muscle weakness (generalized): Secondary | ICD-10-CM

## 2023-12-14 NOTE — Therapy (Unsigned)
 OUTPATIENT PHYSICAL THERAPY NEURO TREATMENT       Patient Name: Derrick Bishop MRN: [id number] DOB:March 29, 1946, 78 y.o., male Today's Date: 12/14/2023   PCP: Dr. Firman Hughes REFERRING PROVIDER: Dr. Devora Folks   END OF SESSION:  PT End of Session - 12/14/23 1653     Visit Number 22    Number of Visits 24    Date for PT Re-Evaluation 12/26/23    Progress Note Due on Visit 30    PT Start Time 1620    PT Stop Time 1700    PT Time Calculation (min) 40 min    Equipment Utilized During Treatment Gait belt    Activity Tolerance Patient tolerated treatment well    Behavior During Therapy WFL for tasks assessed/performed                 Past Medical History:  Diagnosis Date   Hypertension    No past surgical history on file. There are no active problems to display for this patient.   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 States he may be interested in checking out the [facility] for a gym based option.     Pt accompanied by: self  PERTINENT HISTORY:  History obtained by Dr. Mason Sole on 09/15/2023, the below from that note: Assessment & Plan Parkinson's Disease in a patient with bilateral hand tremor (left > right) + slowness of movement + some changes in balance + hypophonia  + micro graphia + hypomimia + minimal bradykinesia on the left + Mild cog wheeling on the right and moderate cog wheeling on the left + decreased arm swing on the left + slight hunched forward posture - symptomatic, without new or worsening features   Parkinson's disease with worsening symptoms, including increased hand tremors, balance problems, and freezing episodes. Current treatment with carbidopa-levodopa three times daily shows no noticeable symptom improvement. Increasing the dose aims to reduce tremors, bradykinesia, and rigidity. The medication is safe with  no long-term damage, and higher doses may be necessary as the disease progresses. Early and adequate dosing can preserve functional years, and the highest tolerable dose should be administered early to maximize functional outcomes. - Increase carbidopa-levodopa to 25-100 mg two times a day  Increase carbidopa-levodopa to 1.5 pills three times daily for two weeks, then increase to 2 pills three times daily. - Order Parkinson's-specific Physical Therapy and Speech Therapy   PAIN:  Are you having pain? No  PRECAUTIONS: Fall  RED FLAGS: None   WEIGHT BEARING RESTRICTIONS: No  FALLS: Has patient fallen in last 6 months? No  LIVING ENVIRONMENT: Lives with: lives with their spouse Lives in: House/apartment Stairs: Yes: External: 3 steps; In process of having railings installed in garage Has following equipment at home: Single point cane  PLOF: Independent  PATIENT GOALS: improve my walking and confidence in getting around.   OBJECTIVE:  Note: Objective measures were completed at Evaluation unless otherwise noted.  DIAGNOSTIC FINDINGS:  Narrative & Impression  CLINICAL DATA:  Neuro deficit, acute, stroke suspected   EXAM: MRI HEAD WITHOUT CONTRAST   TECHNIQUE: Multiplanar, multiecho pulse sequences of the brain and surrounding structures were obtained without intravenous contrast.   COMPARISON:  2012    FINDINGS: Brain: There is no acute infarction or intracranial hemorrhage. There is no intracranial mass, mass effect, or edema. There is no hydrocephalus or extra-axial fluid collection. Ventricles and sulci are within normal limits in size and configuration. Patchy T2 hyperintensity in the supratentorial white matter is nonspecific but may reflect mild chronic microvascular ischemic changes.   Vascular: Major vessel flow voids at the skull base are preserved.   Skull and upper cervical spine: Normal marrow signal is preserved.   Sinuses/Orbits: Paranasal sinus mucosal thickening. Orbits are unremarkable.   Other: Sella is unremarkable.  Mastoid air cells are clear.   IMPRESSION: No acute infarction, hemorrhage, or mass.   Mild chronic microvascular ischemic changes.     Electronically Signed   By: Geannie Keener M.D.   On: 12/28/2021 13:07      COGNITION: Overall cognitive status: Within functional limits for tasks assessed   SENSATION: WFL  COORDINATION: Slow to initiate at times  EDEMA:  None observed    POSTURE: rounded shoulders and forward head  LOWER EXTREMITY ROM:     Active  Right Eval Left Eval  Hip flexion    Hip extension    Hip abduction    Hip adduction    Hip internal rotation    Hip external rotation    Knee flexion    Knee extension    Ankle dorsiflexion    Ankle plantarflexion    Ankle inversion    Ankle eversion     (Blank rows = not tested)  LOWER EXTREMITY MMT:    MMT Right Eval Left Eval  Hip flexion 4 4  Hip extension 4 4  Hip abduction 4 4  Hip adduction    Hip internal rotation 4 4  Hip external rotation 4 4  Knee flexion 4 4  Knee extension 4 4  Ankle dorsiflexion 4 4  Ankle plantarflexion    Ankle inversion    Ankle eversion    (Blank rows = not tested)  BED MOBILITY:  NOT TESTED- Patient reports independent  TRANSFERS: Assistive device utilized: None  Sit to stand: Complete Independence Stand to  sit: Complete Independence Chair to chair: Complete Independence Floor: Not tested  GAIT: Gait pattern: decreased arm swing- Right, decreased arm swing-  Left, decreased step length- Right, decreased step length- Left, decreased stance time- Right, decreased stance time- Left, decreased stride length, shuffling, festinating, poor foot clearance- Right, and poor foot clearance- Left Distance walked: > 100 feet Assistive device utilized: has cane today but did not use- states only using in unfamiliar community settings Level of assistance: CGA   FUNCTIONAL TESTS:  5 times sit to stand: 18.81 sec without UE support Timed up and go (TUG): 16.03 sec avg without AD 6 minute walk test: 937 ftt 10 meter walk test: 0.79 m/s avg without AD Berg Balance Scale: 43/56  PATIENT SURVEYS:  ABC scale 75.6%                                                                                                                              TREATMENT DATE: 12/14/23    Throughout session PT provided CGA for safety and gait belt in place for increased safety.     Self care/Home management:   [facility] activities:  Walked around [facility] and PT demonstrating equipment - [employer], group session room, Treadmill, LE and UE machines and then patient performed Nustep x 10 min and later Octane x 8 min - focusing on ability to use equipment safely - adjusting seat, level of intensity, reading screens to become familiar with equipment.  Spent time discussing importance of continuous movement. Stated would not recommend treadmill at this time due to safety and increased fall risk.      PATIENT EDUCATION: Education details: large amplitude technique, HEP,  Person educated: Patient Education method: Medical illustrator, VC printout Education comprehension: verbalized understanding, returned demonstration, and verbal cues required  HOME EXERCISE PROGRAM:  Access Code: [email or web address] URL:  [email or web address] Date: 10/18/2023 Prepared by: Carlen Chasten  Exercises - Walking  - 1 x daily - 7 x weekly - 1 sets - 1 reps - 6-10 minutes hold - Sit to Stand Without Arm Support  - 1 x daily - 5-6 x weekly - 2-3 sets - 10 reps - Seated Finger Flicks with Elbow Extension  - 1 x daily - 7 x weekly - 2 sets - 20 reps - Seated Reaching to Side and Across Body  - 1 x daily - 7 x weekly - 2 sets - 10 reps  GOALS: Goals reviewed with patient? Yes  SHORT TERM GOALS: Target date: 11/14/2023  Pt will be independent with HEP in order to improve strength and balance in order to decrease fall risk and improve function at home and work. Baseline: 5/7: Every other day, tries to incorporate into everyday tasks  Goal status:MET   LONG TERM GOALS: Target date: 12/26/2023  1.  Patient (> 6 years old) will complete five times sit to stand test in < 15 seconds indicating an increased LE strength and improved balance. Baseline: EVAL= 18.81 sec without UE 11/02/23: 9.26 sec  6/11: 10.91sec no UE support  Goal status: MET  2.  Patient will improve ABC  score to >80%   to demonstrate statistically significant improvement in mobility and quality of life as it relates to their confidence in his balance.  Baseline: EVAL=75.6 % 5/7: 85% 6/11: 89%  Goal status: MET   3.  Patient will increase Berg Balance score by > 6 points to demonstrate decreased fall risk during functional activities. Baseline: EVAL= 43/56 5/7: 47 6/11: 49/56 Goal status: ONGOING    4.   Patient will reduce timed up and go to <11 seconds to reduce fall risk and demonstrate improved transfer/gait ability. Baseline: EVAL= 16.03 without AD 5/7:12.43 sec 6/11: 11.26sec no AD  Goal status: MET   5.   Patient will increase 10 meter walk test to >1.42m/s as to improve gait speed for better community ambulation and to reduce fall risk. Baseline: EVAL= 0.79 m/s avg without AD 5/7:1.02 m/s Goal status: MET  6.   Patient  will increase six minute walk test distance to >1000 for progression to community ambulator and improve gait ability Baseline: EVAL: To be tested next visit; 10/05/23: 937 ft 5/7:1020 ft 6/11: 1330ft with no AD Goal status: MET    ASSESSMENT:  CLINICAL IMPRESSION:   Plan continued per last visit recommendation to focus on Home/community based exercise programs. He was introduced to [facility] and spent time walking around the facility- observing available equipment and PT recommending various activities that he could incorporate into a gym based program. He wanted to practice with the Nustep and later the octane machines today and responded well to all instruction in use today. He will benefit from review and introduction in machines for UE/LE strength training. Pt will continue to benefit from skilled physical therapy intervention to address impairments, improve QOL, and attain therapy goals.     OBJECTIVE IMPAIRMENTS: Abnormal gait, decreased balance, decreased coordination, decreased endurance, decreased mobility, difficulty walking, decreased ROM, decreased strength, hypomobility, impaired flexibility, and postural dysfunction.   ACTIVITY LIMITATIONS: carrying, lifting, bending, standing, squatting, stairs, and transfers  PARTICIPATION LIMITATIONS: cleaning, shopping, community activity, and yard work  PERSONAL FACTORS: 1-2 comorbidities: HTN are also affecting patient's functional outcome.   REHAB POTENTIAL: Good  CLINICAL DECISION MAKING: Stable/uncomplicated  EVALUATION COMPLEXITY: Low  PLAN:  PT FREQUENCY: 1-2x/week  PT DURATION: 12 weeks  PLANNED INTERVENTIONS: 97164- PT Re-evaluation, 97110-Therapeutic exercises, 97530- Therapeutic activity, [id number]- Neuromuscular re-education, 97535- Self Care, 16109- Manual therapy, [phone]- Gait training, [phone]- Orthotic Fit/training, [phone]- Canalith repositioning, [phone]- Electrical stimulation (manual), Patient/Family education, Balance  training, Stair training, Taping, Dry Needling, Joint mobilization, Joint manipulation, Spinal manipulation, Spinal mobilization, Vestibular training, DME instructions, Cryotherapy, and Moist heat  PLAN FOR NEXT SESSION:    -Continue with community based fitness/strengthening.   Barbara Book PT  Physical Therapist- Rafael Hernandez  [facility]  4:54 PM 12/14/23

## 2023-12-14 NOTE — Therapy (Signed)
 OUTPATIENT SPEECH LANGUAGE PATHOLOGY PARKINSON'S TREATMENT   Patient Name: Derrick Bishop MRN: [id number] DOB:11-06-1945, 78 y.o., male Today's Date: 12/14/2023  PCP: Firman Hughes, MD REFERRING PROVIDER: Devora Folks, MD    End of Session - 12/14/23 1529     Visit Number 22    Number of Visits 24    Date for SLP Re-Evaluation 12/26/23    SLP Start Time 1530    SLP Stop Time  1615    SLP Time Calculation (min) 45 min    Activity Tolerance Patient tolerated treatment well          Past Medical History:  Diagnosis Date   Hypertension    No past surgical history on file. There are no active problems to display for this patient.   ONSET DATE:  ~3 years ago;   REFERRING DIAG: Parkinson's Disease without dyskinesia, without mention of fluctuations  THERAPY DIAG:  Dysarthria and anarthria  Rationale for Evaluation and Treatment Rehabilitation  SUBJECTIVE:   SUBJECTIVE STATEMENT: Pt alert, pleasant, and cooperative. Pt accompanied by: self  PERTINENT HISTORY: Per Neuro note, 09/15/23, Parkinson's Disease in a patient with bilateral hand tremor (left > right) + slowness of movement + some changes in balance + hypophonia + micro graphia + hypomimia + minimal bradykinesia on the left + Mild cog wheeling on the right and moderate cog wheeling on the left + decreased arm swing on the left + slight hunched forward posture - symptomatic, without new or worsening features. Pt with hx HTN, HLD, Vit B12 and Vit deficiencies. MRI brain, 12/28/21, no a cute infarction, hemorrhage, or mass. Mild chronic microvascular ischemic changes.    PAIN:  Are you having pain? No   FALLS: Has patient fallen in last 6 months? Defer to PT  LIVING ENVIRONMENT: Lives with: lives with their spouse Lives in: House/apartment  PLOF:  ambulates with single point cane; driving; indep  PATIENT GOALS  for speech to improve  OBJECTIVE:  TODAY'S TREATMENT:  Reviewed EMST including purpose, how  to complete ex's, and maintenance. Pt completed x5 sets of x5 reps of EMST with indep use of hand placement to facilitate adequate lip seal given Bell's Palsy.   Pt introduced to [employer] and its home practice ex's. Pt participated in class with cueing from SLP and biofeedback with Voice Analyst app.  Voice ex's completed as follows: May-Me-My-Moe-Moo: 85dB Sustained /a/: x5, 85-86 dB Ascending and descending pitch glides: 85dB   Pt read x12 functional phrases twice through with loud and intentional voice, averaging 84 dB.   Pt averaged 74-75 dB during 15 minute conversation sample.  Pt educated re: changes to speech/voice due to Parkinson's Disease, voice ex's, EMST, SLP POC, and HEP.  PATIENT EDUCATION: Education details: as above Person educated: Patient  Education method: Programmer, multimedia; Handout Education comprehension: verbalized understanding and needs further education   HOME EXERCISE PROGRAM: Voice Ex's and Functional Reading given previous / HEP on Parkinson's Voice Project EMST  GOALS: Goals reviewed with patient? Yes  SHORT TERM GOALS: Target date: 10 sessions  Pt will participate in further assessment of functional cognitive-linguistic ability with additional goals to be set as appropriate. Baseline: Goal status: MET  2.  The patient will complete HEP (Maximum duration ah, High/Lows, and Functional Phrases) at average loudness >/= 80 dB and with loud, good quality voice with min cues.  Baseline: Goal status: MET  3.  Pt will sustain conversational loudness (75-85 dB) for 3-5 minutes of conversation with min cues. Baseline:  Goal status: MET    LONG TERM GOALS: Target date: 12 weeks  The patient will complete HEP (Maximum duration ah, High/Lows, and Functional Phrases) at average loudness >/= 80 dB and with loud, good quality voice indep. Baseline: Goal status: PROGRESSING  Pt will sustain conversational loudness (75-85 dB) for 8-10  minutes of conversation with min cues. Baseline:  Goal status: PROGRESSING   ASSESSMENT:  CLINICAL IMPRESSION: Patient is a 78 y.o. male who was seen today for evaluation in setting of Parkinson's Disease. er Neuro note, 09/15/23, Parkinson's Disease in a patient with bilateral hand tremor (left > right) + slowness of movement + some changes in balance + hypophonia + micro graphia + hypomimia + minimal bradykinesia on the left + Mild cog wheeling on the right and moderate cog wheeling on the left + decreased arm swing on the left + slight hunched forward posture - symptomatic, without new or worsening features. Pt taking carbidopa-levodopa for Parkinson's Disease with recent increased in dosage.  Pt with hx HTN, HLD, Vit B12 and Vit deficiencies. MRI brain, 12/28/21, no a cute infarction, hemorrhage, or mass. Mild chronic microvascular ischemic changes. Pt presents with a mild hypokinetic dysarthric c/b hypophonia, mildly hoarse vocal quality and intermittent fast rushes of speech. Evidence of volume decay noted. Wife endorsed that speech is worse in home with pt often muttering. Wife also identified x2 persons who have stopped communicating with pt due to difficulty hearing him. Pt stimulable to verbal cues and would benefit from course of ST to improve pt's ability to communicate at home and in the community.  OBJECTIVE IMPAIRMENTS include dysarthria. These impairments are limiting patient from effectively communicating at home and in community. Factors affecting potential to achieve goals and functional outcome are n/a. Patient will benefit from skilled SLP services to address above impairments and improve overall function.  REHAB POTENTIAL: Good  PLAN: SLP FREQUENCY: 1-2x/week  SLP DURATION: 12 weeks  PLANNED INTERVENTIONS: Functional tasks, Multimodal communication approach, SLP instruction and feedback, Compensatory strategies, Patient/family education, and further cognitive-linguistic  assessment.   Dia Forget, M.S., CCC-SLP Speech-Language Pathologist [facility] [phone] [place])   [facility] [address] [place], [place], [address] Phone: [phone]   Fax:  [phone]

## 2023-12-19 ENCOUNTER — Ambulatory Visit: Admitting: Physical Therapy

## 2023-12-19 ENCOUNTER — Ambulatory Visit

## 2023-12-19 DIAGNOSIS — R269 Unspecified abnormalities of gait and mobility: Secondary | ICD-10-CM

## 2023-12-19 DIAGNOSIS — R2681 Unsteadiness on feet: Secondary | ICD-10-CM

## 2023-12-19 DIAGNOSIS — R471 Dysarthria and anarthria: Secondary | ICD-10-CM | POA: Diagnosis not present

## 2023-12-19 DIAGNOSIS — R278 Other lack of coordination: Secondary | ICD-10-CM

## 2023-12-19 DIAGNOSIS — M6281 Muscle weakness (generalized): Secondary | ICD-10-CM

## 2023-12-19 DIAGNOSIS — R262 Difficulty in walking, not elsewhere classified: Secondary | ICD-10-CM

## 2023-12-19 DIAGNOSIS — G20A1 Parkinson's disease without dyskinesia, without mention of fluctuations: Secondary | ICD-10-CM

## 2023-12-19 NOTE — Therapy (Unsigned)
 OUTPATIENT PHYSICAL THERAPY NEURO TREATMENT       Patient Name: Derrick Bishop MRN: [id number] DOB:[birth year], 78 y.o., male Today's Date: 12/19/2023   PCP: Dr. Oneil Pinal REFERRING PROVIDER: Dr. Jannett Fairly   END OF SESSION:  PT End of Session - 12/19/23 1616     Visit Number 23    Number of Visits 24    Date for PT Re-Evaluation 12/26/23    Progress Note Due on Visit 30    PT Start Time 1615    PT Stop Time 1655    PT Time Calculation (min) 40 min    Equipment Utilized During Treatment Gait belt    Activity Tolerance Patient tolerated treatment well    Behavior During Therapy WFL for tasks assessed/performed                 Past Medical History:  Diagnosis Date   Hypertension    No past surgical history on file. There are no active problems to display for this patient.   ONSET DATE: 3 years ago  REFERRING DIAG: Parkinson's disease without dyskinesia, without mention of fluctuations   THERAPY DIAG:  Difficulty in walking, not elsewhere classified  Abnormality of gait and mobility  Parkinson's disease without dyskinesia or fluctuating manifestations (HCC)  Muscle weakness (generalized)  Unsteadiness on feet  Other lack of coordination  Rationale for Evaluation and Treatment: Rehabilitation  SUBJECTIVE:                                                                                                                                                                                             SUBJECTIVE STATEMENT:   Pt reports that he has been consistent with HEP, but hoping for expanded HEP with education of community fitness options.   Pt accompanied by: self  PERTINENT HISTORY:  History obtained by Dr. Fairly on 09/15/2023, the below from that note: Assessment & Plan Parkinson's Disease in a patient with bilateral hand tremor (left > right) + slowness of movement + some changes in balance + hypophonia + micro graphia + hypomimia + minimal  bradykinesia on the left + Mild cog wheeling on the right and moderate cog wheeling on the left + decreased arm swing on the left + slight hunched forward posture - symptomatic, without new or worsening features   Parkinson's disease with worsening symptoms, including increased hand tremors, balance problems, and freezing episodes. Current treatment with carbidopa-levodopa three times daily shows no noticeable symptom improvement. Increasing the dose aims to reduce tremors, bradykinesia, and rigidity. The medication is safe with no long-term damage, and higher doses may be necessary as the  disease progresses. Early and adequate dosing can preserve functional years, and the highest tolerable dose should be administered early to maximize functional outcomes. - Increase carbidopa-levodopa to 25-100 mg two times a day  Increase carbidopa-levodopa to 1.5 pills three times daily for two weeks, then increase to 2 pills three times daily. - Order Parkinson's-specific Physical Therapy and Speech Therapy   PAIN:  Are you having pain? No  PRECAUTIONS: Fall  RED FLAGS: None   WEIGHT BEARING RESTRICTIONS: No  FALLS: Has patient fallen in last 6 months? No  LIVING ENVIRONMENT: Lives with: lives with their spouse Lives in: House/apartment Stairs: Yes: External: 3 steps; In process of having railings installed in garage Has following equipment at home: Single point cane  PLOF: Independent  PATIENT GOALS: improve my walking and confidence in getting around.   OBJECTIVE:  Note: Objective measures were completed at Evaluation unless otherwise noted.  DIAGNOSTIC FINDINGS:  Narrative & Impression  CLINICAL DATA:  Neuro deficit, acute, stroke suspected   EXAM: MRI HEAD WITHOUT CONTRAST   TECHNIQUE: Multiplanar, multiecho pulse sequences of the brain and surrounding structures were obtained without intravenous contrast.   COMPARISON:  2012   FINDINGS: Brain: There is no acute infarction or  intracranial hemorrhage. There is no intracranial mass, mass effect, or edema. There is no hydrocephalus or extra-axial fluid collection. Ventricles and sulci are within normal limits in size and configuration. Patchy T2 hyperintensity in the supratentorial white matter is nonspecific but may reflect mild chronic microvascular ischemic changes.   Vascular: Major vessel flow voids at the skull base are preserved.   Skull and upper cervical spine: Normal marrow signal is preserved.   Sinuses/Orbits: Paranasal sinus mucosal thickening. Orbits are unremarkable.   Other: Sella is unremarkable.  Mastoid air cells are clear.   IMPRESSION: No acute infarction, hemorrhage, or mass.   Mild chronic microvascular ischemic changes.     Electronically Signed   By: Santina Blanch M.D.   On: 12/28/2021 13:07      COGNITION: Overall cognitive status: Within functional limits for tasks assessed   SENSATION: WFL  COORDINATION: Slow to initiate at times  EDEMA:  None observed    POSTURE: rounded shoulders and forward head  LOWER EXTREMITY ROM:     Active  Right Eval Left Eval  Hip flexion    Hip extension    Hip abduction    Hip adduction    Hip internal rotation    Hip external rotation    Knee flexion    Knee extension    Ankle dorsiflexion    Ankle plantarflexion    Ankle inversion    Ankle eversion     (Blank rows = not tested)  LOWER EXTREMITY MMT:    MMT Right Eval Left Eval  Hip flexion 4 4  Hip extension 4 4  Hip abduction 4 4  Hip adduction    Hip internal rotation 4 4  Hip external rotation 4 4  Knee flexion 4 4  Knee extension 4 4  Ankle dorsiflexion 4 4  Ankle plantarflexion    Ankle inversion    Ankle eversion    (Blank rows = not tested)  BED MOBILITY:  NOT TESTED- Patient reports independent  TRANSFERS: Assistive device utilized: None  Sit to stand: Complete Independence Stand to sit: Complete Independence Chair to chair: Complete  Independence Floor: Not tested  GAIT: Gait pattern: decreased arm swing- Right, decreased arm swing- Left, decreased step length- Right, decreased step length- Left, decreased stance  time- Right, decreased stance time- Left, decreased stride length, shuffling, festinating, poor foot clearance- Right, and poor foot clearance- Left Distance walked: > 100 feet Assistive device utilized: has cane today but did not use- states only using in unfamiliar community settings Level of assistance: CGA   FUNCTIONAL TESTS:  5 times sit to stand: 18.81 sec without UE support Timed up and go (TUG): 16.03 sec avg without AD 6 minute walk test: 937 ftt 10 meter walk test: 0.79 m/s avg without AD Berg Balance Scale: 43/56  PATIENT SURVEYS:  ABC scale 75.6%                                                                                                                              TREATMENT DATE: 12/19/23    Access Code: [facility] URL: [email or web address] Date: 12/19/2023 Prepared by: Massie Dollar  Exercises - Seated Punches with Trunk Rotation  - 1 x daily - 7 x weekly - 3 sets - 10 reps - 1 hold - Seated Finger Flicks with Elbow Extension  - 1 x daily - 7 x weekly - 3 sets - 10 reps - Seated Reach Forward, Up, and To Sides  - 1 x daily - 7 x weekly - 3 sets - 10 reps - 1 hold - sit to stand with arm reach  - 1 x daily - 7 x weekly - 3 sets - 10 reps - Step Sideways with Arms Reaching  - 1 x daily - 7 x weekly - 3 sets - 10 reps - 1 hold - Step Forward with Arms Reaching to Sides  - 1 x daily - 7 x weekly - 3 sets - 10 reps - Walking  - 1 x daily - 7 x weekly - 3 sets - 10 reps - 6-10 min  hold   Continued education for community fitness.  Leg press level 3 x 15, level 4 x 12, level 5 x12 HS curl level 3  3x 10-12  Cues for eccentric control, set up and, proper ROM      PATIENT EDUCATION: Education details: large amplitude technique, HEP,  Pt educated throughout session  about proper posture and technique with exercises. Improved exercise technique, movement at target joints, use of target muscles after min to mod verbal, visual, tactile cues.  Person educated: Patient Education method: Medical illustrator, VC printout Education comprehension: verbalized understanding, returned demonstration, and verbal cues required  HOME EXERCISE PROGRAM:  Access Code: [email or web address] URL: [email or web address] Date: 10/18/2023 Prepared by: Connell Kiss  Exercises - Walking  - 1 x daily - 7 x weekly - 1 sets - 1 reps - 6-10 minutes hold - Sit to Stand Without Arm Support  - 1 x daily - 5-6 x weekly - 2-3 sets - 10 reps - Seated Finger Flicks with Elbow Extension  - 1 x daily - 7 x weekly - 2 sets - 20 reps - Seated Reaching to Side and Across Body  -  1 x daily - 7 x weekly - 2 sets - 10 reps  Access Code: [facility] URL: [email or web address] Date: 12/19/2023 Prepared by: Massie Dollar  Exercises - Seated Punches with Trunk Rotation  - 1 x daily - 7 x weekly - 3 sets - 10 reps - 1 hold - Seated Finger Flicks with Elbow Extension  - 1 x daily - 7 x weekly - 3 sets - 10 reps - Seated Reach Forward, Up, and To Sides  - 1 x daily - 7 x weekly - 3 sets - 10 reps - 1 hold - sit to stand with arm reach  - 1 x daily - 7 x weekly - 3 sets - 10 reps - Step Sideways with Arms Reaching  - 1 x daily - 7 x weekly - 3 sets - 10 reps - 1 hold - Step Forward with Arms Reaching to Sides  - 1 x daily - 7 x weekly - 3 sets - 10 reps - Walking  - 1 x daily - 7 x weekly - 3 sets - 10 reps - 6-10 min  hold  GOALS: Goals reviewed with patient? Yes  SHORT TERM GOALS: Target date: 11/14/2023  Pt will be independent with HEP in order to improve strength and balance in order to decrease fall risk and improve function at home and work. Baseline: 5/7: Every other day, tries to incorporate into everyday tasks  Goal status:MET   LONG TERM GOALS: Target date:  12/26/2023  1.  Patient (> 50 years old) will complete five times sit to stand test in < 15 seconds indicating an increased LE strength and improved balance. Baseline: EVAL= 18.81 sec without UE 11/02/23: 9.26 sec  6/11: 10.91sec no UE support  Goal status: MET  2.  Patient will improve ABC score to >80%   to demonstrate statistically significant improvement in mobility and quality of life as it relates to their confidence in his balance.  Baseline: EVAL=75.6 % 5/7: 85% 6/11: 89%  Goal status: MET   3.  Patient will increase Berg Balance score by > 6 points to demonstrate decreased fall risk during functional activities. Baseline: EVAL= 43/56 5/7: 47 6/11: 49/56 Goal status: ONGOING    4.   Patient will reduce timed up and go to <11 seconds to reduce fall risk and demonstrate improved transfer/gait ability. Baseline: EVAL= 16.03 without AD 5/7:12.43 sec 6/11: 11.26sec no AD  Goal status: MET   5.   Patient will increase 10 meter walk test to >1.74m/s as to improve gait speed for better community ambulation and to reduce fall risk. Baseline: EVAL= 0.79 m/s avg without AD 5/7:1.02 m/s Goal status: MET  6.   Patient will increase six minute walk test distance to >1000 for progression to community ambulator and improve gait ability Baseline: EVAL: To be tested next visit; 10/05/23: 937 ft 5/7:1020 ft 6/11: 1351ft with no AD Goal status: MET    ASSESSMENT:  CLINICAL IMPRESSION:   Plan continued per last visit recommendation to focus on Home/community based exercise programs. HEP advancement provided with emphasis on PD deficits or reduced rotation, improved step length and increased large amplitude movement. Also continued introduction to gym based fitness program to improve cardiovascular fitness and muscle strength. Will provide HEP for gym equipment at next session.   Pt will continue to benefit from skilled physical therapy intervention to address impairments, improve QOL, and attain  therapy goals.     OBJECTIVE IMPAIRMENTS: Abnormal gait, decreased balance, decreased coordination, decreased  endurance, decreased mobility, difficulty walking, decreased ROM, decreased strength, hypomobility, impaired flexibility, and postural dysfunction.   ACTIVITY LIMITATIONS: carrying, lifting, bending, standing, squatting, stairs, and transfers  PARTICIPATION LIMITATIONS: cleaning, shopping, community activity, and yard work  PERSONAL FACTORS: 1-2 comorbidities: HTN are also affecting patient's functional outcome.   REHAB POTENTIAL: Good  CLINICAL DECISION MAKING: Stable/uncomplicated  EVALUATION COMPLEXITY: Low  PLAN:  PT FREQUENCY: 1-2x/week  PT DURATION: 12 weeks  PLANNED INTERVENTIONS: 97164- PT Re-evaluation, 97110-Therapeutic exercises, 97530- Therapeutic activity, [id number]- Neuromuscular re-education, 97535- Self Care, 02859- Manual therapy, [phone]- Gait training, [phone]- Orthotic Fit/training, [phone]- Canalith repositioning, [phone]- Electrical stimulation (manual), Patient/Family education, Balance training, Stair training, Taping, Dry Needling, Joint mobilization, Joint manipulation, Spinal manipulation, Spinal mobilization, Vestibular training, DME instructions, Cryotherapy, and Moist heat  PLAN FOR NEXT SESSION:    -Continue with community based fitness/strengthening.  D/c assessment,   Massie FORBES Dollar PT  Physical Therapist- [facility]  [facility]  4:17 PM 12/19/23

## 2023-12-19 NOTE — Therapy (Signed)
 OUTPATIENT SPEECH LANGUAGE PATHOLOGY PARKINSON'S TREATMENT   Patient Name: Derrick Bishop MRN: [id number] DOB:1945/12/13, 78 y.o., male Today's Date: 12/19/2023  PCP: Oneil Pinal, MD REFERRING PROVIDER: Jannett Fairly, MD    End of Session - 12/19/23 1517     Visit Number 23    Number of Visits 24    Date for SLP Re-Evaluation 12/26/23    SLP Start Time 1520    SLP Stop Time  1605    SLP Time Calculation (min) 45 min    Activity Tolerance Patient tolerated treatment well          Past Medical History:  Diagnosis Date   Hypertension    No past surgical history on file. There are no active problems to display for this patient.   ONSET DATE:  ~3 years ago;   REFERRING DIAG: Parkinson's Disease without dyskinesia, without mention of fluctuations  THERAPY DIAG:  Dysarthria and anarthria  Rationale for Evaluation and Treatment Rehabilitation  SUBJECTIVE:   SUBJECTIVE STATEMENT: Pt alert, pleasant, and cooperative. Pt accompanied by: self  PERTINENT HISTORY: Per Neuro note, 09/15/23, Parkinson's Disease in a patient with bilateral hand tremor (left > right) + slowness of movement + some changes in balance + hypophonia + micro graphia + hypomimia + minimal bradykinesia on the left + Mild cog wheeling on the right and moderate cog wheeling on the left + decreased arm swing on the left + slight hunched forward posture - symptomatic, without new or worsening features. Pt with hx HTN, HLD, Vit B12 and Vit deficiencies. MRI brain, 12/28/21, no a cute infarction, hemorrhage, or mass. Mild chronic microvascular ischemic changes.    PAIN:  Are you having pain? No   FALLS: Has patient fallen in last 6 months? Defer to PT  LIVING ENVIRONMENT: Lives with: lives with their spouse Lives in: House/apartment  PLOF:  ambulates with single point cane; driving; indep  PATIENT GOALS  for speech to improve  OBJECTIVE:  TODAY'S TREATMENT:  Reviewed EMST including purpose, how  to complete ex's, and maintenance. Pt completed x5 sets of x5 reps of EMST with indep use of hand placement to facilitate adequate lip seal given Bell's Palsy.  Handout provided, at pt's request, with directions for maintaining EMST.   Pt with indep completion of HEP. Pt >80dB throughout.   Pt averaged ~75 dB during 15 minute conversation sample.  Pt educated re: changes to speech/voice due to Parkinson's Disease, voice ex's, EMST, SLP POC, and HEP.  PATIENT EDUCATION: Education details: as above Person educated: Patient  Education method: Programmer, multimedia; Handout Education comprehension: verbalized understanding and needs further education   HOME EXERCISE PROGRAM: Voice Ex's and Functional Reading given previous / HEP on Parkinson's Voice Project EMST  GOALS: Goals reviewed with patient? Yes  SHORT TERM GOALS: Target date: 10 sessions  Pt will participate in further assessment of functional cognitive-linguistic ability with additional goals to be set as appropriate. Baseline: Goal status: MET  2.  The patient will complete HEP (Maximum duration ah, High/Lows, and Functional Phrases) at average loudness >/= 80 dB and with loud, good quality voice with min cues.  Baseline: Goal status: MET  3.  Pt will sustain conversational loudness (75-85 dB) for 3-5 minutes of conversation with min cues. Baseline:  Goal status: MET    LONG TERM GOALS: Target date: 12 weeks  The patient will complete HEP (Maximum duration ah, High/Lows, and Functional Phrases) at average loudness >/= 80 dB and with loud, good quality voice indep. Baseline:  Goal status: PROGRESSING  Pt will sustain conversational loudness (75-85 dB) for 8-10 minutes of conversation with min cues. Baseline:  Goal status: PROGRESSING   ASSESSMENT:  CLINICAL IMPRESSION: Patient is a 78 y.o. male who was seen today for evaluation in setting of Parkinson's Disease. er Neuro note, 09/15/23, Parkinson's Disease in a  patient with bilateral hand tremor (left > right) + slowness of movement + some changes in balance + hypophonia + micro graphia + hypomimia + minimal bradykinesia on the left + Mild cog wheeling on the right and moderate cog wheeling on the left + decreased arm swing on the left + slight hunched forward posture - symptomatic, without new or worsening features. Pt taking carbidopa-levodopa for Parkinson's Disease with recent increased in dosage.  Pt with hx HTN, HLD, Vit B12 and Vit deficiencies. MRI brain, 12/28/21, no a cute infarction, hemorrhage, or mass. Mild chronic microvascular ischemic changes. Pt presents with a mild hypokinetic dysarthric c/b hypophonia, mildly hoarse vocal quality and intermittent fast rushes of speech. Evidence of volume decay noted. Wife endorsed that speech is worse in home with pt often muttering. Wife also identified x2 persons who have stopped communicating with pt due to difficulty hearing him. Pt stimulable to verbal cues and would benefit from course of ST to improve pt's ability to communicate at home and in the community.  OBJECTIVE IMPAIRMENTS include dysarthria. These impairments are limiting patient from effectively communicating at home and in community. Factors affecting potential to achieve goals and functional outcome are n/a. Patient will benefit from skilled SLP services to address above impairments and improve overall function.  REHAB POTENTIAL: Good  PLAN: SLP FREQUENCY: 1-2x/week  SLP DURATION: 12 weeks  PLANNED INTERVENTIONS: Functional tasks, Multimodal communication approach, SLP instruction and feedback, Compensatory strategies, Patient/family education, and further cognitive-linguistic assessment.   Delon Bangs, M.S., CCC-SLP Speech-Language Pathologist [facility] [phone] [place])   [facility] Outpatient Rehabilitation at [facility] [address] [place], [place],  [address] Phone: [phone]   Fax:  [phone]

## 2023-12-21 ENCOUNTER — Ambulatory Visit

## 2023-12-21 ENCOUNTER — Ambulatory Visit: Admitting: Physical Therapy

## 2023-12-21 DIAGNOSIS — R471 Dysarthria and anarthria: Secondary | ICD-10-CM | POA: Diagnosis not present

## 2023-12-21 DIAGNOSIS — R278 Other lack of coordination: Secondary | ICD-10-CM

## 2023-12-21 DIAGNOSIS — M6281 Muscle weakness (generalized): Secondary | ICD-10-CM

## 2023-12-21 DIAGNOSIS — G20A1 Parkinson's disease without dyskinesia, without mention of fluctuations: Secondary | ICD-10-CM

## 2023-12-21 DIAGNOSIS — R269 Unspecified abnormalities of gait and mobility: Secondary | ICD-10-CM

## 2023-12-21 DIAGNOSIS — R2681 Unsteadiness on feet: Secondary | ICD-10-CM

## 2023-12-21 DIAGNOSIS — R262 Difficulty in walking, not elsewhere classified: Secondary | ICD-10-CM

## 2023-12-21 NOTE — Therapy (Signed)
 OUTPATIENT SPEECH LANGUAGE PATHOLOGY PARKINSON'S DISCHARGE NOTE   Patient Name: Derrick Bishop MRN: [id number] DOB:May 19, 1946, 78 y.o., male Today's Date: 12/21/2023  PCP: Oneil Pinal, MD REFERRING PROVIDER: Jannett Fairly, MD    End of Session - 12/21/23 1615     Visit Number 24    Number of Visits 24    Date for SLP Re-Evaluation 12/26/23    SLP Start Time 1530    SLP Stop Time  1615    SLP Time Calculation (min) 45 min    Activity Tolerance Patient tolerated treatment well          Past Medical History:  Diagnosis Date   Hypertension    No past surgical history on file. There are no active problems to display for this patient.   ONSET DATE:  ~3 years ago;   REFERRING DIAG: Parkinson's Disease without dyskinesia, without mention of fluctuations  THERAPY DIAG:  Dysarthria and anarthria  Rationale for Evaluation and Treatment Rehabilitation  SUBJECTIVE:   SUBJECTIVE STATEMENT: Pt alert, pleasant, and cooperative. Pt accompanied by: self  PERTINENT HISTORY: Per Neuro note, 09/15/23, Parkinson's Disease in a patient with bilateral hand tremor (left > right) + slowness of movement + some changes in balance + hypophonia + micro graphia + hypomimia + minimal bradykinesia on the left + Mild cog wheeling on the right and moderate cog wheeling on the left + decreased arm swing on the left + slight hunched forward posture - symptomatic, without new or worsening features. Pt with hx HTN, HLD, Vit B12 and Vit deficiencies. MRI brain, 12/28/21, no a cute infarction, hemorrhage, or mass. Mild chronic microvascular ischemic changes.    PAIN:  Are you having pain? No   FALLS: Has patient fallen in last 6 months? Defer to PT  LIVING ENVIRONMENT: Lives with: lives with their spouse Lives in: House/apartment  PLOF:  ambulates with single point cane; driving; indep  PATIENT GOALS  for speech to improve  OBJECTIVE:  TODAY'S TREATMENT:  Reviewed EMST including purpose,  how to complete ex's, and maintenance. Pt completed x5 sets of x5 reps of EMST with indep use of hand placement to facilitate adequate lip seal given Bell's Palsy.  Pt reports indep completion of HEP.   Pt averaged 75 dB during 15 minute conversation sample; 79 dB during reading sample ([place]; 72 dB on eval).  Pt educated re: changes to speech/voice due to Parkinson's Disease, voice ex's, EMST, SLP POC, and HEP.  PATIENT EDUCATION: Education details: as above Person educated: Patient  Education method: Programmer, multimedia; Handout Education comprehension: verbalized understanding   HOME EXERCISE PROGRAM: Voice Ex's and Functional Reading given previous / HEP on Parkinson's Voice Project EMST  GOALS: Goals reviewed with patient? Yes  SHORT TERM GOALS: Target date: 10 sessions  Pt will participate in further assessment of functional cognitive-linguistic ability with additional goals to be set as appropriate. Baseline: Goal status: MET  2.  The patient will complete HEP (Maximum duration ah, High/Lows, and Functional Phrases) at average loudness >/= 80 dB and with loud, good quality voice with min cues.  Baseline: Goal status: MET  3.  Pt will sustain conversational loudness (75-85 dB) for 3-5 minutes of conversation with min cues. Baseline:  Goal status: MET    LONG TERM GOALS: Target date: 12 weeks  The patient will complete HEP (Maximum duration ah, High/Lows, and Functional Phrases) at average loudness >/= 80 dB and with loud, good quality voice indep. Baseline: Goal status: MET  Pt will sustain  conversational loudness (75-85 dB) for 8-10 minutes of conversation with min cues. Baseline:  Goal status: MET   ASSESSMENT:  CLINICAL IMPRESSION: Patient is a 78 y.o. 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 See above for details of tx session. Pt has met all ST goals and is indep in HEP. Improved vocal  intensity over course of tx with use of EMST and intensity-based ST. Pt to be d/c'd from ST this date as pt has no further ST needs at this time.   PLAN: D/C ST; all goals met and pt indep with HEP   Delon Bangs, M.S., CCC-SLP Speech-Language Pathologist [facility] [phone] [place])   [facility] Outpatient Rehabilitation at [facility] [address] [place], [place], [address] Phone: [phone]   Fax:  [phone]

## 2023-12-21 NOTE — Therapy (Unsigned)
 OUTPATIENT PHYSICAL THERAPY NEURO TREATMENT/ Discharge Summary.        Patient Name: Derrick Bishop MRN: [id number] DOB:06-28-1946, 78 y.o., male Today's Date: 12/22/2023   PCP: Dr. Oneil Pinal REFERRING PROVIDER: Dr. Jannett Fairly   END OF SESSION:  PT End of Session - 12/21/23 1617     Visit Number 24    Number of Visits 24    Date for PT Re-Evaluation 12/26/23    Progress Note Due on Visit 30    PT Start Time 1617    PT Stop Time 1657    PT Time Calculation (min) 40 min    Equipment Utilized During Treatment Gait belt    Activity Tolerance Patient tolerated treatment well    Behavior During Therapy WFL for tasks assessed/performed                 Past Medical History:  Diagnosis Date   Hypertension    No past surgical history on file. There are no active problems to display for this patient.   ONSET DATE: 3 years ago  REFERRING DIAG: Parkinson's disease without dyskinesia, without mention of fluctuations   THERAPY DIAG:  Difficulty in walking, not elsewhere classified  Abnormality of gait and mobility  Parkinson's disease without dyskinesia or fluctuating manifestations (HCC)  Muscle weakness (generalized)  Unsteadiness on feet  Other lack of coordination  Rationale for Evaluation and Treatment: Rehabilitation  SUBJECTIVE:                                                                                                                                                                                             SUBJECTIVE STATEMENT:   Pt reports that he has been consistent with HEP, but hoping for expanded HEP with education of community fitness options.   Pt accompanied by: self  PERTINENT HISTORY:  History obtained by Dr. Fairly on 09/15/2023, the below from that note: Assessment & Plan Parkinson's Disease in a patient with bilateral hand tremor (left > right) + slowness of movement + some changes in balance + hypophonia + micro graphia +  hypomimia + minimal bradykinesia on the left + Mild cog wheeling on the right and moderate cog wheeling on the left + decreased arm swing on the left + slight hunched forward posture - symptomatic, without new or worsening features   Parkinson's disease with worsening symptoms, including increased hand tremors, balance problems, and freezing episodes. Current treatment with carbidopa-levodopa three times daily shows no noticeable symptom improvement. Increasing the dose aims to reduce tremors, bradykinesia, and rigidity. The medication is safe with no long-term damage, and higher doses may be  necessary as the disease progresses. Early and adequate dosing can preserve functional years, and the highest tolerable dose should be administered early to maximize functional outcomes. - Increase carbidopa-levodopa to 25-100 mg two times a day  Increase carbidopa-levodopa to 1.5 pills three times daily for two weeks, then increase to 2 pills three times daily. - Order Parkinson's-specific Physical Therapy and Speech Therapy   PAIN:  Are you having pain? No  PRECAUTIONS: Fall  RED FLAGS: None   WEIGHT BEARING RESTRICTIONS: No  FALLS: Has patient fallen in last 6 months? No  LIVING ENVIRONMENT: Lives with: lives with their spouse Lives in: House/apartment Stairs: Yes: External: 3 steps; In process of having railings installed in garage Has following equipment at home: Single point cane  PLOF: Independent  PATIENT GOALS: improve my walking and confidence in getting around.   OBJECTIVE:  Note: Objective measures were completed at Evaluation unless otherwise noted.  DIAGNOSTIC FINDINGS:  Narrative & Impression  CLINICAL DATA:  Neuro deficit, acute, stroke suspected   EXAM: MRI HEAD WITHOUT CONTRAST   TECHNIQUE: Multiplanar, multiecho pulse sequences of the brain and surrounding structures were obtained without intravenous contrast.   COMPARISON:  2012   FINDINGS: Brain: There is no  acute infarction or intracranial hemorrhage. There is no intracranial mass, mass effect, or edema. There is no hydrocephalus or extra-axial fluid collection. Ventricles and sulci are within normal limits in size and configuration. Patchy T2 hyperintensity in the supratentorial white matter is nonspecific but may reflect mild chronic microvascular ischemic changes.   Vascular: Major vessel flow voids at the skull base are preserved.   Skull and upper cervical spine: Normal marrow signal is preserved.   Sinuses/Orbits: Paranasal sinus mucosal thickening. Orbits are unremarkable.   Other: Sella is unremarkable.  Mastoid air cells are clear.   IMPRESSION: No acute infarction, hemorrhage, or mass.   Mild chronic microvascular ischemic changes.     Electronically Signed   By: Santina Blanch M.D.   On: 12/28/2021 13:07      COGNITION: Overall cognitive status: Within functional limits for tasks assessed   SENSATION: WFL  COORDINATION: Slow to initiate at times  EDEMA:  None observed    POSTURE: rounded shoulders and forward head  LOWER EXTREMITY ROM:     Active  Right Eval Left Eval  Hip flexion    Hip extension    Hip abduction    Hip adduction    Hip internal rotation    Hip external rotation    Knee flexion    Knee extension    Ankle dorsiflexion    Ankle plantarflexion    Ankle inversion    Ankle eversion     (Blank rows = not tested)  LOWER EXTREMITY MMT:    MMT Right Eval Left Eval  Hip flexion 4 4  Hip extension 4 4  Hip abduction 4 4  Hip adduction    Hip internal rotation 4 4  Hip external rotation 4 4  Knee flexion 4 4  Knee extension 4 4  Ankle dorsiflexion 4 4  Ankle plantarflexion    Ankle inversion    Ankle eversion    (Blank rows = not tested)  BED MOBILITY:  NOT TESTED- Patient reports independent  TRANSFERS: Assistive device utilized: None  Sit to stand: Complete Independence Stand to sit: Complete Independence Chair  to chair: Complete Independence Floor: Not tested  GAIT: Gait pattern: decreased arm swing- Right, decreased arm swing- Left, decreased step length- Right, decreased step length-  Left, decreased stance time- Right, decreased stance time- Left, decreased stride length, shuffling, festinating, poor foot clearance- Right, and poor foot clearance- Left Distance walked: > 100 feet Assistive device utilized: has cane today but did not use- states only using in unfamiliar community settings Level of assistance: CGA   FUNCTIONAL TESTS:  5 times sit to stand: 18.81 sec without UE support Timed up and go (TUG): 16.03 sec avg without AD 6 minute walk test: 937 ftt 10 meter walk test: 0.79 m/s avg without AD Berg Balance Scale: 43/56  PATIENT SURVEYS:  ABC scale 75.6%                                                                                                                              TREATMENT DATE: 12/22/23      [facility] PT Assessment - 12/22/23 0001       Berg Balance Test   Sit to Stand Able to stand without using hands and stabilize independently    Standing Unsupported Able to stand safely 2 minutes    Sitting with Back Unsupported but Feet Supported on Floor or Stool Able to sit safely and securely 2 minutes    Stand to Sit Sits safely with minimal use of hands    Transfers Able to transfer safely, minor use of hands    Standing Unsupported with Eyes Closed Able to stand 10 seconds safely    Standing Unsupported with Feet Together Able to place feet together independently and stand 1 minute safely    From Standing, Reach Forward with Outstretched Arm Can reach confidently >25 cm (10)    From Standing Position, Pick up Object from Floor Able to pick up shoe safely and easily    From Standing Position, Turn to Look Behind Over each Shoulder Looks behind one side only/other side shows less weight shift    Turn 360 Degrees Able to turn 360 degrees safely in 4 seconds or less     Standing Unsupported, Alternately Place Feet on Step/Stool Able to stand independently and safely and complete 8 steps in 20 seconds    Standing Unsupported, One Foot in Front Able to plae foot ahead of the other independently and hold 30 seconds    Standing on One Leg Able to lift leg independently and hold equal to or more than 3 seconds      Functional Gait  Assessment   Gait Level Surface Walks 20 ft in less than 5.5 sec, no assistive devices, good speed, no evidence for imbalance, normal gait pattern, deviates no more than 6 in outside of the 12 in walkway width.    Change in Gait Speed Able to smoothly change walking speed without loss of balance or gait deviation. Deviate no more than 6 in outside of the 12 in walkway width.    Gait with Horizontal Head Turns Performs head turns smoothly with slight change in gait velocity (eg, minor disruption to smooth gait path),  deviates 6-10 in outside 12 in walkway width, or uses an assistive device.    Gait with Vertical Head Turns Performs head turns with no change in gait. Deviates no more than 6 in outside 12 in walkway width.    Gait and Pivot Turn Pivot turns safely within 3 sec and stops quickly with no loss of balance.    Step Over Obstacle Is able to step over one shoe box (4.5 in total height) but must slow down and adjust steps to clear box safely. May require verbal cueing.    Gait with Narrow Base of Support Ambulates less than 4 steps heel to toe or cannot perform without assistance.    Gait with Eyes Closed Cannot walk 20 ft without assistance, severe gait deviations or imbalance, deviates greater than 15 in outside 12 in walkway width or will not attempt task.    Ambulating Backwards Walks 20 ft, slow speed, abnormal gait pattern, evidence for imbalance, deviates 10-15 in outside 12 in walkway width.    Steps Alternating feet, must use rail.         Patient demonstrates increased fall risk as noted by score of   52/56 on Berg Balance  Scale.  (<36= high risk for falls, close to 100%; 37-45 significant >80%; 46-51 moderate >50%; 52-55 lower >25%)  Patient demonstrates increased fall risk as noted by score of 18/30 on  Functional Gait Assessment.   <22/30 = predictive of falls, <20/30 = fall in 6 months, <18/30 = predictive of falls in PD MCID: 5 points stroke population, 4 points geriatric population (ANPTA Core Set of Outcome Measures for Adults with Neurologic Conditions, 2018)  6 Min Walk Test:  Instructed patient to ambulate as quickly and as safely as possible for 6 minutes using LRAD. Patient was allowed to take standing rest breaks without stopping the test, but if the patient required a sitting rest break the clock would be stopped and the test would be over.  Results: 1234ft without AD. In dynamic environment. No AD , no Assist. Results indicate that the patient has reduced endurance with ambulation compared to age matched norms.  Age Matched Norms: 21-69 yo M: 68 F: 34, 60-79 yo M: 55 F: 471, 19-89 yo M: 417 F: 392 MDC: 58.21 meters (190.98 feet) or 50 meters (ANPTA Core Set of Outcome Measures for Adults with Neurologic Conditions, 2018)   Provided gym based HEP  Access Code: [email or web address] URL: [email or web address] Date: 12/22/2023 Prepared by: Massie Dollar  Exercises - Full Leg Press  - 1 x daily - 3-4 x weekly - 3 sets - 8-12 reps - Knee Extension with Weight Machine  - 1 x daily - 3-4 x weekly - 3 sets - 8-12 reps - Hamstring Curl with Weight Machine  - 1 x daily - 3-4 x weekly - 3 sets - 8-12 reps - Low Row with TRX  - 1 x daily - 4 x weekly - 3 sets - 10 reps - 3 hold - Recumbent Bike  - 1 x daily - 7 x weekly - 2 sets - 5-1minutes  hold  PATIENT EDUCATION: Education details: large amplitude technique, HEP,  Pt educated throughout session about proper posture and technique with exercises. Improved exercise technique, movement at target joints, use of target muscles after min to mod  verbal, visual, tactile cues. Encouraged to participate in gymv based fitness and rocksteady boxing.   Person educated: Patient Education method: Medical illustrator, VC printout Education comprehension: verbalized understanding, returned demonstration,  and verbal cues required  HOME EXERCISE PROGRAM: Access Code: [email or web address] URL: [email or web address] Date: 12/22/2023 Prepared by: Massie Dollar  Exercises - Full Leg Press  - 1 x daily - 3-4 x weekly - 3 sets - 8-12 reps - Knee Extension with Weight Machine  - 1 x daily - 3-4 x weekly - 3 sets - 8-12 reps - Hamstring Curl with Weight Machine  - 1 x daily - 3-4 x weekly - 3 sets - 8-12 reps - Low Row with TRX  - 1 x daily - 4 x weekly - 3 sets - 10 reps - 3 hold - Recumbent Bike  - 1 x daily - 7 x weekly - 2 sets - 5-71minutes  hold  Access Code: [email or web address] URL: [email or web address] Date: 10/18/2023 Prepared by: Connell Kiss  Exercises - Walking  - 1 x daily - 7 x weekly - 1 sets - 1 reps - 6-10 minutes hold - Sit to Stand Without Arm Support  - 1 x daily - 5-6 x weekly - 2-3 sets - 10 reps - Seated Finger Flicks with Elbow Extension  - 1 x daily - 7 x weekly - 2 sets - 20 reps - Seated Reaching to Side and Across Body  - 1 x daily - 7 x weekly - 2 sets - 10 reps  Access Code: [facility] URL: [email or web address] Date: 12/19/2023 Prepared by: Massie Dollar  Exercises - Seated Punches with Trunk Rotation  - 1 x daily - 7 x weekly - 3 sets - 10 reps - 1 hold - Seated Finger Flicks with Elbow Extension  - 1 x daily - 7 x weekly - 3 sets - 10 reps - Seated Reach Forward, Up, and To Sides  - 1 x daily - 7 x weekly - 3 sets - 10 reps - 1 hold - sit to stand with arm reach  - 1 x daily - 7 x weekly - 3 sets - 10 reps - Step Sideways with Arms Reaching  - 1 x daily - 7 x weekly - 3 sets - 10 reps - 1 hold - Step Forward with Arms Reaching to Sides  - 1 x daily - 7 x weekly - 3 sets - 10  reps - Walking  - 1 x daily - 7 x weekly - 3 sets - 10 reps - 6-10 min  hold  GOALS: Goals reviewed with patient? Yes  SHORT TERM GOALS: Target date: 11/14/2023  Pt will be independent with HEP in order to improve strength and balance in order to decrease fall risk and improve function at home and work. Baseline: 5/7: Every other day, tries to incorporate into everyday tasks  Goal status:MET   LONG TERM GOALS: Target date: 12/26/2023  1.  Patient (> 29 years old) will complete five times sit to stand test in < 15 seconds indicating an increased LE strength and improved balance. Baseline: EVAL= 18.81 sec without UE 11/02/23: 9.26 sec  6/11: 10.91sec no UE support 6/25: 8.83   Goal status: MET  2.  Patient will improve ABC score to >80%   to demonstrate statistically significant improvement in mobility and quality of life as it relates to their confidence in his balance.  Baseline: EVAL=75.6 % 5/7: 85% 6/11: 89%  Goal status: MET   3.  Patient will increase Berg Balance score by > 6 points to demonstrate decreased fall risk during functional activities. Baseline: EVAL= 43/56 5/7: 47 6/11: 49/56 6/25: 52/56 Goal status: MET    4.  Patient will reduce timed up and go to <11 seconds to reduce fall risk and demonstrate improved transfer/gait ability. Baseline: EVAL= 16.03 without AD 5/7:12.43 sec 6/11: 11.26sec no AD  Goal status: MET   5.   Patient will increase 10 meter walk test to >1.42m/s as to improve gait speed for better community ambulation and to reduce fall risk. Baseline: EVAL= 0.79 m/s avg without AD 5/7:1.02 m/s Goal status: MET  6.   Patient will increase six minute walk test distance to >1000 for progression to community ambulator and improve gait ability Baseline: EVAL: To be tested next visit; 10/05/23: 937 ft 5/7:1020 ft 6/11: 1317ft with no AD 6/25: 1231ft without AD. In dynamic environment.  Goal status: MET    ASSESSMENT:  CLINICAL IMPRESSION:   Pt has  met all PT goals. Demonstrates improved balance and safety with mobility with 5xSTS<10 sec. 6 min walk test >1065ft. Berg balance scale of 52/56 and FGA of 18/30. HEP provided for home and gym based programs and encouraged to participate in community PD classes. Due to improved balance, reduced festination, and improved cardiovascular endurance, pt will no longer require skilled PT services at this time.    OBJECTIVE IMPAIRMENTS: Abnormal gait, decreased balance, decreased coordination, decreased endurance, decreased mobility, difficulty walking, decreased ROM, decreased strength, hypomobility, impaired flexibility, and postural dysfunction.   ACTIVITY LIMITATIONS: carrying, lifting, bending, standing, squatting, stairs, and transfers  PARTICIPATION LIMITATIONS: cleaning, shopping, community activity, and yard work  PERSONAL FACTORS: 1-2 comorbidities: HTN are also affecting patient's functional outcome.   REHAB POTENTIAL: Good  CLINICAL DECISION MAKING: Stable/uncomplicated  EVALUATION COMPLEXITY: Low  PLAN:  PT FREQUENCY: 1-2x/week  PT DURATION: 12 weeks  PLANNED INTERVENTIONS: 97164- PT Re-evaluation, 97110-Therapeutic exercises, 97530- Therapeutic activity, 97112- Neuromuscular re-education, 97535- Self Care, [id number]- Manual therapy, [phone]- Gait training, [phone]- Orthotic Fit/training, [phone]- Canalith repositioning, [phone]- Electrical stimulation (manual), Patient/Family education, Balance training, Stair training, Taping, Dry Needling, Joint mobilization, Joint manipulation, Spinal manipulation, Spinal mobilization, Vestibular training, DME instructions, Cryotherapy, and Moist heat  PLAN FOR NEXT SESSION:   NA  Massie FORBES Dollar PT  Physical Therapist- [place]   [facility]  8:25 AM 12/22/23

## 2023-12-26 ENCOUNTER — Ambulatory Visit: Admitting: Physical Therapy

## 2023-12-26 ENCOUNTER — Ambulatory Visit

## 2023-12-28 ENCOUNTER — Encounter

## 2023-12-28 ENCOUNTER — Ambulatory Visit: Admitting: Physical Therapy

## 2024-01-02 ENCOUNTER — Ambulatory Visit: Admitting: Physical Therapy

## 2024-01-02 ENCOUNTER — Encounter

## 2024-01-04 ENCOUNTER — Ambulatory Visit: Admitting: Physical Therapy

## 2024-01-04 ENCOUNTER — Encounter

## 2024-01-09 ENCOUNTER — Ambulatory Visit: Admitting: Physical Therapy

## 2024-01-09 ENCOUNTER — Encounter

## 2024-01-11 ENCOUNTER — Ambulatory Visit: Admitting: Physical Therapy

## 2024-01-11 ENCOUNTER — Encounter

## 2024-01-16 ENCOUNTER — Ambulatory Visit: Admitting: Physical Therapy

## 2024-01-16 ENCOUNTER — Encounter

## 2024-01-18 ENCOUNTER — Encounter

## 2024-01-18 ENCOUNTER — Ambulatory Visit: Admitting: Physical Therapy

## 2024-01-23 ENCOUNTER — Encounter

## 2024-01-23 ENCOUNTER — Ambulatory Visit: Admitting: Physical Therapy

## 2024-01-25 ENCOUNTER — Encounter

## 2024-01-25 ENCOUNTER — Ambulatory Visit: Admitting: Physical Therapy

## 2024-01-30 ENCOUNTER — Encounter

## 2024-01-30 ENCOUNTER — Ambulatory Visit: Admitting: Physical Therapy

## 2024-02-01 ENCOUNTER — Encounter

## 2024-02-01 ENCOUNTER — Ambulatory Visit: Admitting: Physical Therapy

## 2024-02-06 ENCOUNTER — Encounter

## 2024-02-06 ENCOUNTER — Ambulatory Visit: Admitting: Physical Therapy

## 2024-02-08 ENCOUNTER — Ambulatory Visit: Admitting: Physical Therapy

## 2024-02-08 ENCOUNTER — Encounter

## 2024-02-13 ENCOUNTER — Ambulatory Visit: Admitting: Physical Therapy

## 2024-02-13 ENCOUNTER — Encounter

## 2024-02-15 ENCOUNTER — Ambulatory Visit: Admitting: Physical Therapy

## 2024-02-15 ENCOUNTER — Encounter

## 2024-02-20 ENCOUNTER — Encounter

## 2024-02-20 ENCOUNTER — Ambulatory Visit: Admitting: Physical Therapy

## 2024-02-22 ENCOUNTER — Encounter

## 2024-02-22 ENCOUNTER — Ambulatory Visit: Admitting: Physical Therapy

## 2024-02-29 ENCOUNTER — Encounter

## 2024-02-29 ENCOUNTER — Ambulatory Visit: Admitting: Physical Therapy

## 2024-03-05 ENCOUNTER — Ambulatory Visit: Admitting: Physical Therapy

## 2024-03-05 ENCOUNTER — Encounter

## 2024-03-07 ENCOUNTER — Ambulatory Visit: Admitting: Physical Therapy

## 2024-03-07 ENCOUNTER — Encounter

## 2024-03-12 ENCOUNTER — Encounter

## 2024-03-12 ENCOUNTER — Ambulatory Visit: Admitting: Physical Therapy

## 2024-03-14 ENCOUNTER — Encounter

## 2024-03-14 ENCOUNTER — Ambulatory Visit: Admitting: Physical Therapy

## 2024-03-19 ENCOUNTER — Ambulatory Visit: Admitting: Physical Therapy

## 2024-03-19 ENCOUNTER — Encounter

## 2024-03-21 ENCOUNTER — Ambulatory Visit: Admitting: Physical Therapy

## 2024-03-21 ENCOUNTER — Encounter

## 2024-03-26 ENCOUNTER — Ambulatory Visit: Admitting: Physical Therapy

## 2024-03-26 ENCOUNTER — Encounter

## 2024-03-28 ENCOUNTER — Ambulatory Visit: Admitting: Physical Therapy

## 2024-03-28 ENCOUNTER — Encounter

## 2024-04-02 ENCOUNTER — Ambulatory Visit: Admitting: Physical Therapy

## 2024-04-02 ENCOUNTER — Encounter

## 2024-04-04 ENCOUNTER — Ambulatory Visit: Admitting: Physical Therapy

## 2024-04-04 ENCOUNTER — Encounter

## 2024-04-09 ENCOUNTER — Encounter

## 2024-04-09 ENCOUNTER — Ambulatory Visit: Admitting: Physical Therapy

## 2024-04-11 ENCOUNTER — Ambulatory Visit: Admitting: Physical Therapy

## 2024-04-11 ENCOUNTER — Encounter

## 2024-04-16 ENCOUNTER — Ambulatory Visit: Admitting: Physical Therapy

## 2024-04-16 ENCOUNTER — Encounter

## 2024-04-18 ENCOUNTER — Encounter

## 2024-04-18 ENCOUNTER — Ambulatory Visit: Admitting: Physical Therapy
